# Patient Record
Sex: Female | Born: 1990 | Race: White | Hispanic: No | Marital: Single | Smoking: Current every day smoker
Health system: Southern US, Community
[De-identification: ages and names within clinical notes are randomized; demographics above are authoritative.]

## PROBLEM LIST (undated history)

## (undated) DIAGNOSIS — R519 Headache, unspecified: Secondary | ICD-10-CM

## (undated) DIAGNOSIS — F32A Depression, unspecified: Secondary | ICD-10-CM

## (undated) DIAGNOSIS — F419 Anxiety disorder, unspecified: Secondary | ICD-10-CM

## (undated) HISTORY — DX: Depression, unspecified: F32.A

## (undated) HISTORY — DX: Anxiety disorder, unspecified: F41.9

## (undated) HISTORY — DX: Headache, unspecified: R51.9

---

## 2008-04-13 ENCOUNTER — Emergency Department: Payer: Self-pay | Admitting: Emergency Medicine

## 2013-07-16 ENCOUNTER — Other Ambulatory Visit: Payer: Self-pay

## 2013-07-17 ENCOUNTER — Other Ambulatory Visit (HOSPITAL_COMMUNITY): Payer: Self-pay | Admitting: Obstetrics and Gynecology

## 2013-07-17 DIAGNOSIS — IMO0001 Reserved for inherently not codable concepts without codable children: Secondary | ICD-10-CM

## 2013-07-17 DIAGNOSIS — Z3689 Encounter for other specified antenatal screening: Secondary | ICD-10-CM

## 2013-08-04 ENCOUNTER — Ambulatory Visit (HOSPITAL_COMMUNITY): Admission: RE | Admit: 2013-08-04 | Payer: Medicaid Other | Source: Ambulatory Visit

## 2013-08-04 ENCOUNTER — Encounter (HOSPITAL_COMMUNITY): Payer: Self-pay

## 2013-08-04 ENCOUNTER — Ambulatory Visit (HOSPITAL_COMMUNITY)
Admission: RE | Admit: 2013-08-04 | Discharge: 2013-08-04 | Disposition: A | Discharge status: Home / Self Care | Payer: Medicaid Other | Source: Ambulatory Visit | Attending: Obstetrics and Gynecology | Admitting: Obstetrics and Gynecology

## 2013-08-04 DIAGNOSIS — Z3689 Encounter for other specified antenatal screening: Secondary | ICD-10-CM

## 2013-08-04 DIAGNOSIS — O30009 Twin pregnancy, unspecified number of placenta and unspecified number of amniotic sacs, unspecified trimester: Secondary | ICD-10-CM | POA: Insufficient documentation

## 2013-08-04 DIAGNOSIS — A6000 Herpesviral infection of urogenital system, unspecified: Secondary | ICD-10-CM | POA: Insufficient documentation

## 2013-08-04 DIAGNOSIS — O360990 Maternal care for other rhesus isoimmunization, unspecified trimester, not applicable or unspecified: Secondary | ICD-10-CM | POA: Insufficient documentation

## 2013-08-04 DIAGNOSIS — IMO0001 Reserved for inherently not codable concepts without codable children: Secondary | ICD-10-CM

## 2013-08-04 DIAGNOSIS — O98519 Other viral diseases complicating pregnancy, unspecified trimester: Secondary | ICD-10-CM | POA: Insufficient documentation

## 2013-08-04 DIAGNOSIS — O30039 Twin pregnancy, monochorionic/diamniotic, unspecified trimester: Secondary | ICD-10-CM | POA: Insufficient documentation

## 2013-08-04 IMAGING — US US OB DETAIL EACH ADDL GEST + 14 WK
1 series · 14 of 28 positions shown · non-contrast
Comparison: none

[Series 1: us ob detail each addl gest + 14 wk · 0.18mm/px · 115 acquisitions, 14 frames shown]
[im 5/115]
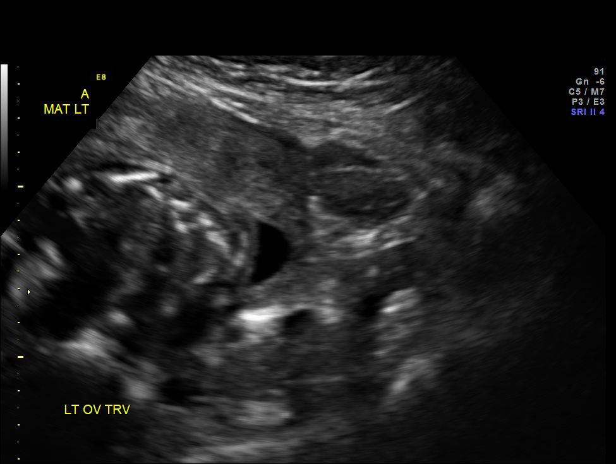
[im 13/115]
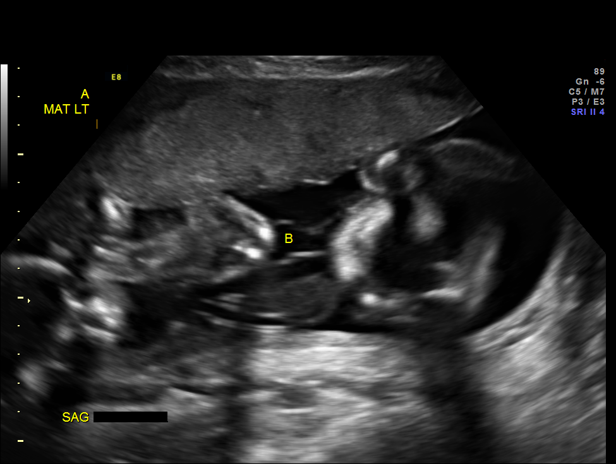
[im 22/115]
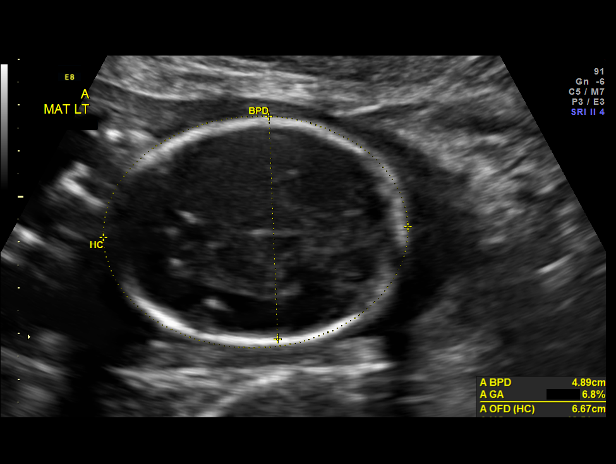
[im 30/115]
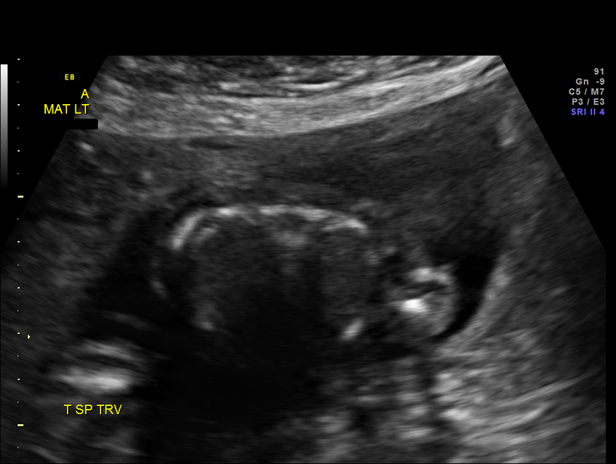
[im 39/115]
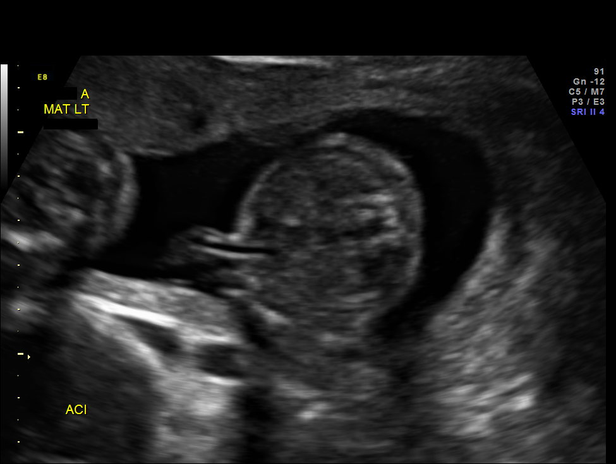
[im 47/115]
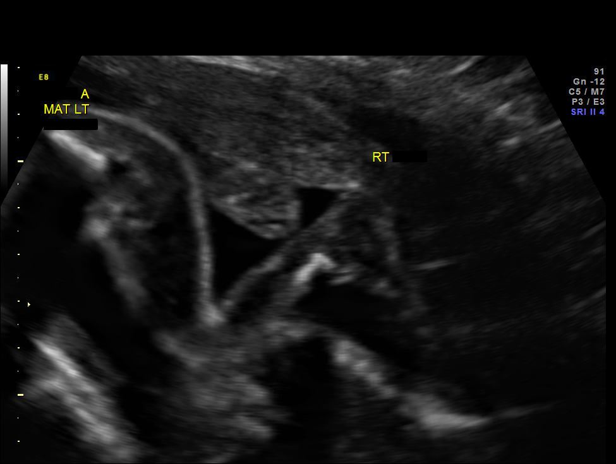
[im 55/115]
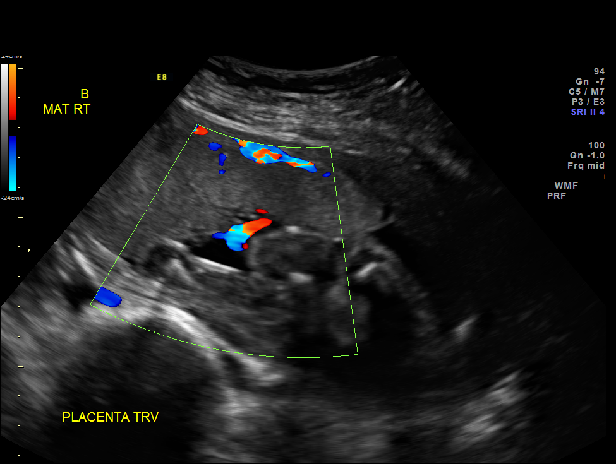
[im 64/115]
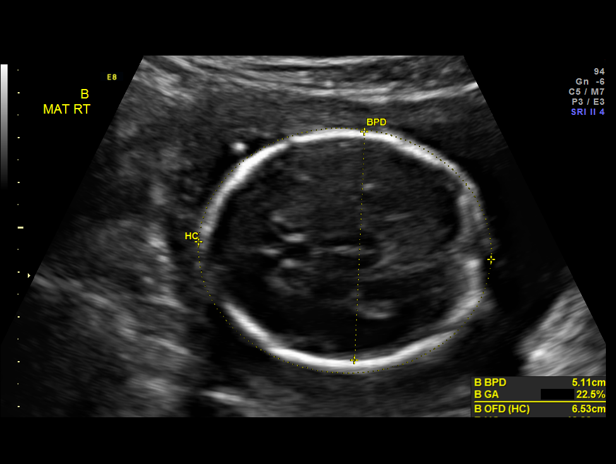
[im 72/115]
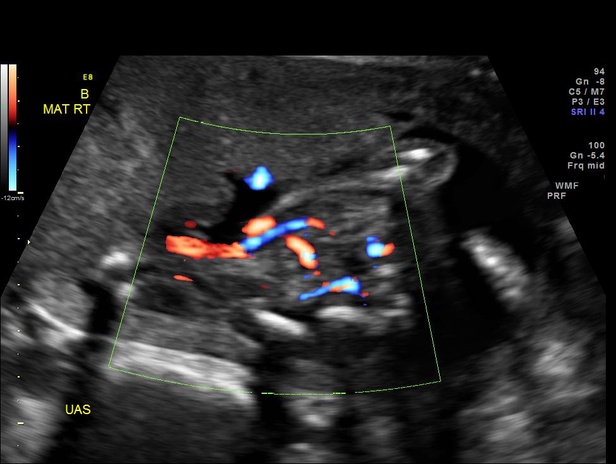
[im 81/115]
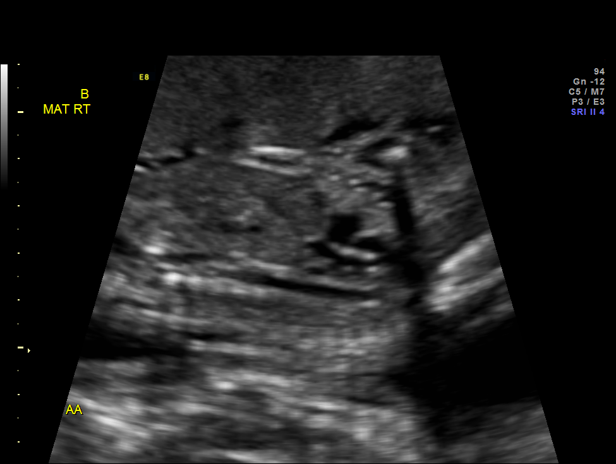
[im 89/115]
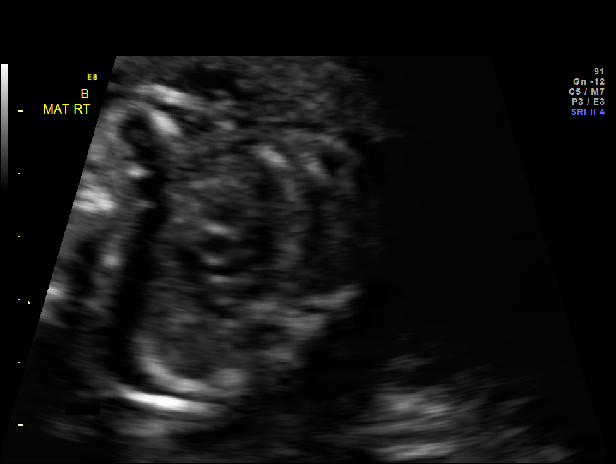
[im 98/115]
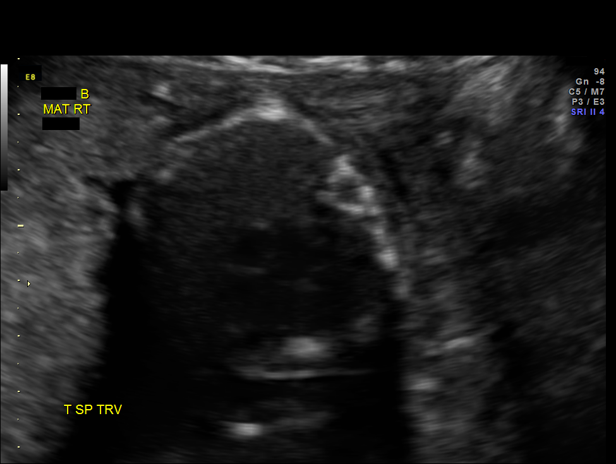
[im 106/115]
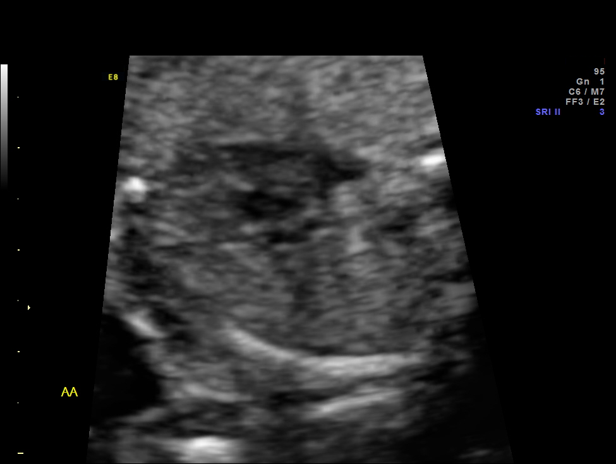
[im 115/115]
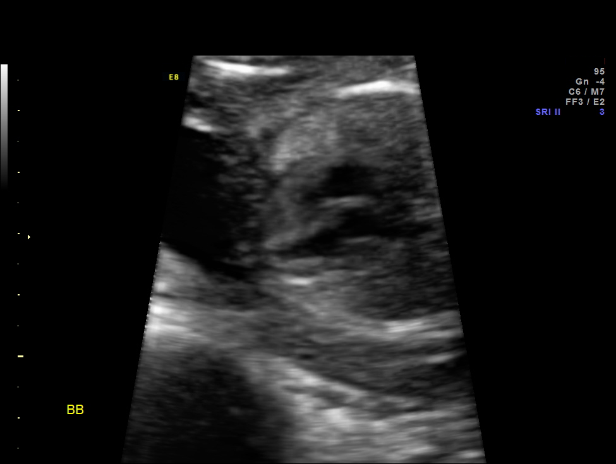

[14 of 28 positions shown; findings below may reference images not displayed]

OBSTETRICS REPORT
                      (Signed Final 08/04/2013 [DATE])

Service(s) Provided

 US OB DETAIL + 14 WK                                  76811.0
 US OB DETAIL ADDL GEST + 14 WK                        76811.1
Indications

 Twin gestation, [place]
 Poor obstetric history: Previous gestational HTN
 Herpes simplex virus (SAID ABDUU)
 Rh negative
Fetal Evaluation (Fetus A)

 Num Of Fetuses:    2
 Fetal Heart Rate:  152                          bpm
 Cardiac Activity:  Observed
 Fetal Lie:         Left Fetus
 Presentation:      Breech
 Placenta:          Anterior, above cervical os
 P. Cord            Visualized, central
 Insertion:

 Membrane Desc:     Dividing
                    Membrane seen
                    - Monochorionic

 Amniotic Fluid
 AFI FV:      Subjectively within normal limits
                                             Larg Pckt:     6.3  cm
Biometry (Fetus A)

 BPD:     48.9  mm     G. Age:  20w 5d                CI:         72.4   70 - 86
 OFD:     67.5  mm                                    FL/HC:      18.9   18.4 -

 HC:     186.6  mm     G. Age:  21w 0d        6  %    HC/AC:      1.11   1.06 -

 AC:     168.4  mm     G. Age:  21w 6d       33  %    FL/BPD:     72.2   71 - 87
 FL:      35.3  mm     G. Age:  21w 1d       13  %    FL/AC:      21.0   20 - 24
 HUM:       34  mm     G. Age:  21w 4d       35  %
 CER:       23  mm     G. Age:  21w 4d       33  %

 Est. FW:     425  gm    0 lb 15 oz      28  %     FW Discordancy      0 \ 0 %
Gestational Age (Fetus A)
 LMP:           22w 1d        Date:  03/02/13                 EDD:   12/07/13
 U/S Today:     21w 1d                                        EDD:   12/14/13
 Best:          22w 1d     Det. By:  LMP  (03/02/13)          EDD:   12/07/13
Anatomy (Fetus A)

 Cranium:          Appears normal         Aortic Arch:      Color
 Fetal Cavum:      Appears normal         Ductal Arch:      Color
 Ventricles:       Appears normal         Diaphragm:        Appears normal
 Choroid Plexus:   Appears normal         Stomach:          Appears normal
 Cerebellum:       Appears normal         Abdomen:          Appears normal
 Posterior Fossa:  Appears normal         Abdominal Wall:   Appears nml (cord
                                                            insert, abd wall)
 Nuchal Fold:      Not applicable (>20    Cord Vessels:     Appears normal (3
                   wks GA)                                  vessel cord)
 Face:             Orbits appear          Kidneys:          Appear normal
                   normal
 Lips:             Not well visualized    Bladder:          Appears normal
 Heart:            Appears normal         Spine:            Appears normal
                   (4CH, axis, and
                   situs)
 RVOT:             Appears normal         Lower             Appears normal
                                          Extremities:
 LVOT:             Appears normal         Upper             Appears normal
                                          Extremities:

 Other:  Fetus appears to be a female. Heels visualized. Technically difficult
         due to fetal position.
Targeted Anatomy (Fetus A)

 Fetal Central Nervous System
 Cisterna Magna:

Fetal Evaluation (Fetus B)

 Num Of Fetuses:    2
 Fetal Heart Rate:  144                          bpm
 Cardiac Activity:  Observed
 Fetal Lie:         Right Fetus
 Presentation:      Cephalic
 Placenta:          Anterior, above cervical os
 P. Cord            Visualized, central
 Insertion:

 Membrane Desc:     Dividing
                    Membrane seen
                    - Monochorionic

 Amniotic Fluid
 AFI FV:      Subjectively within normal limits
                                             Larg Pckt:     3.3  cm
Biometry (Fetus B)

 BPD:     51.3  mm     G. Age:  21w 4d                CI:         77.6   70 - 86
 OFD:     66.1  mm                                    FL/HC:      19.1   18.4 -

 HC:     189.6  mm     G. Age:  21w 2d        9  %    HC/AC:      1.16   1.06 -

 AC:     163.7  mm     G. Age:  21w 3d       22  %    FL/BPD:     70.8   71 - 87
 FL:      36.3  mm     G. Age:  21w 4d       21  %    FL/AC:      22.2   20 - 24
 HUM:     33.1  mm     G. Age:  21w 1d       25  %
 CER:     22.8  mm     G. Age:  21w 3d       30  %

 Est. FW:     425  gm    0 lb 15 oz      28  %     FW Discordancy      0 \ 0 %
Gestational Age (Fetus B)

 LMP:           22w 1d        Date:  03/02/13                 EDD:   12/07/13
 U/S Today:     21w 3d                                        EDD:   12/12/13
 Best:          22w 1d     Det. By:  LMP  (03/02/13)          EDD:   12/07/13
Anatomy (Fetus B)

 Cranium:          Appears normal         Aortic Arch:      Appears normal
 Fetal Cavum:      Appears normal         Ductal Arch:      Not well visualized
 Ventricles:       Appears normal         Diaphragm:        Appears normal
 Choroid Plexus:   Appears normal         Stomach:          Appears normal
 Cerebellum:       Appears normal         Abdomen:          Appears normal
 Posterior Fossa:  Appears normal         Abdominal Wall:   Appears nml (cord
                                                            insert, abd wall)
 Nuchal Fold:      Not applicable (>20    Cord Vessels:     Appears normal (3
                   wks GA)                                  vessel cord)
 Face:             Orbits appear          Kidneys:          Appear normal
                   normal
 Lips:             Appears normal         Bladder:          Appears normal
 Heart:            Appears normal         Spine:            Appears normal
                   (4CH, axis, and
                   situs)
 RVOT:             Appears normal         Lower             Appears normal
                                          Extremities:
 LVOT:             Appears normal         Upper             Appears normal
                                          Extremities:

 Other:  Fetus appears to be a female. Technically difficult due to fetal position.
Targeted Anatomy (Fetus B)

 Fetal Central Nervous System
 Cisterna Magna:
Cervix Uterus Adnexa

 Cervical Length:    3.7      cm

 Cervix:       Normal appearance by transabdominal scan.
 Left Ovary:    Within normal limits.
 Right Ovary:   Within normal limits.

 Adnexa:     No abnormality visualized.
Impression

 Monochorionic/diamniotic twin pregnancy at 22+1 weeks
 Normal detailed fetal anatomy x 2 except for face view on
 Twin A; profile and DA on Twin B
 Normal amniotic fluid volume x 2
 Measurements consistent with LMP dating x 2; EFWs at the
 28th %tiles
Recommendations

 Every 2 weeks ultrasounds: fluid check at one visit; growth
 and UA dopplers at next visit looking for evidence of TTTS
 Fetal ECHOs scheduled
 Begin antenatal testing at 32 weeks
 Deliver between 36 and 37 weeks

 questions or concerns.

## 2013-08-17 ENCOUNTER — Other Ambulatory Visit (HOSPITAL_COMMUNITY): Payer: Self-pay | Admitting: Obstetrics and Gynecology

## 2013-08-17 DIAGNOSIS — O30009 Twin pregnancy, unspecified number of placenta and unspecified number of amniotic sacs, unspecified trimester: Secondary | ICD-10-CM

## 2013-08-19 ENCOUNTER — Ambulatory Visit (HOSPITAL_COMMUNITY)
Admission: RE | Admit: 2013-08-19 | Discharge: 2013-08-19 | Disposition: A | Discharge status: Home / Self Care | Payer: Medicaid Other | Source: Ambulatory Visit | Attending: Obstetrics and Gynecology | Admitting: Obstetrics and Gynecology

## 2013-08-19 ENCOUNTER — Ambulatory Visit (HOSPITAL_COMMUNITY): Payer: Medicaid Other

## 2013-08-19 DIAGNOSIS — O30009 Twin pregnancy, unspecified number of placenta and unspecified number of amniotic sacs, unspecified trimester: Secondary | ICD-10-CM

## 2013-08-19 DIAGNOSIS — A6000 Herpesviral infection of urogenital system, unspecified: Secondary | ICD-10-CM | POA: Insufficient documentation

## 2013-08-19 DIAGNOSIS — O98519 Other viral diseases complicating pregnancy, unspecified trimester: Secondary | ICD-10-CM | POA: Insufficient documentation

## 2013-08-19 DIAGNOSIS — O360990 Maternal care for other rhesus isoimmunization, unspecified trimester, not applicable or unspecified: Secondary | ICD-10-CM | POA: Insufficient documentation

## 2013-08-19 DIAGNOSIS — O09299 Supervision of pregnancy with other poor reproductive or obstetric history, unspecified trimester: Secondary | ICD-10-CM | POA: Insufficient documentation

## 2013-08-19 IMAGING — US US OB LIMITED
1 series · 13 of 16 positions shown · non-contrast
Comparison: none

[Series 1: us ob limited · 0.28mm/px · 13 of 16 slices shown]
[im 1/16]
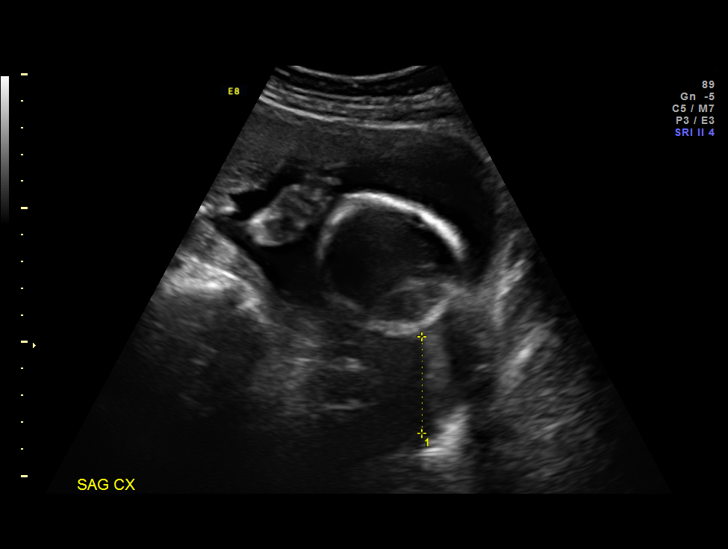
[im 2/16]
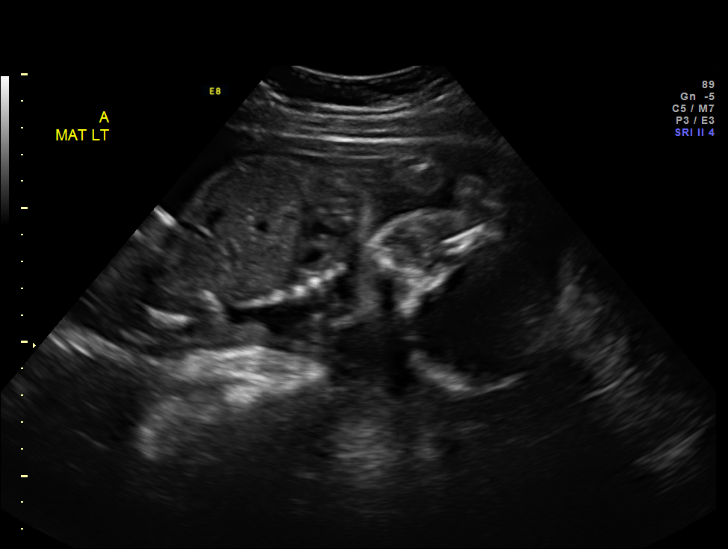
[im 4/16]
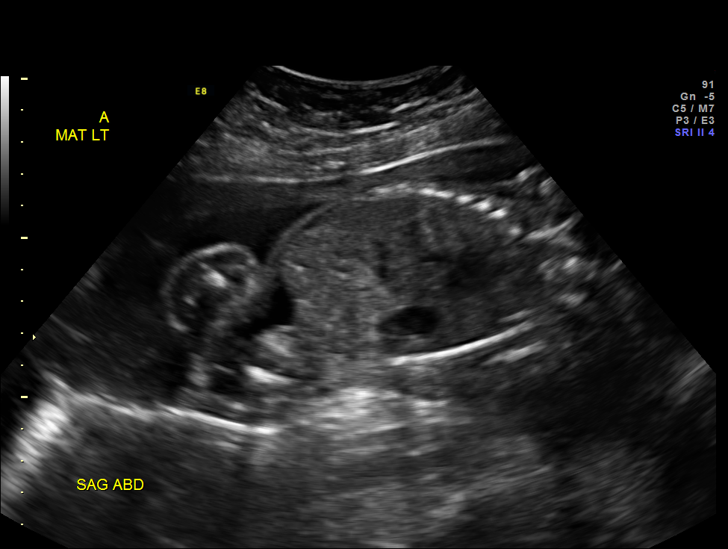
[im 5/16]
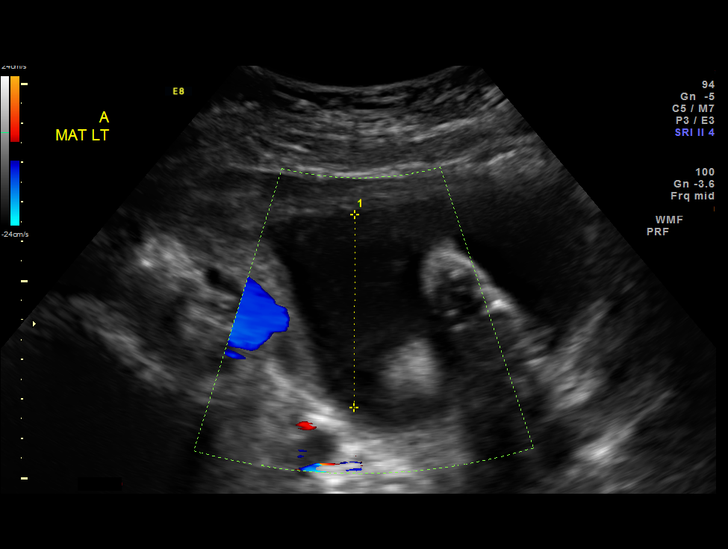
[im 6/16]
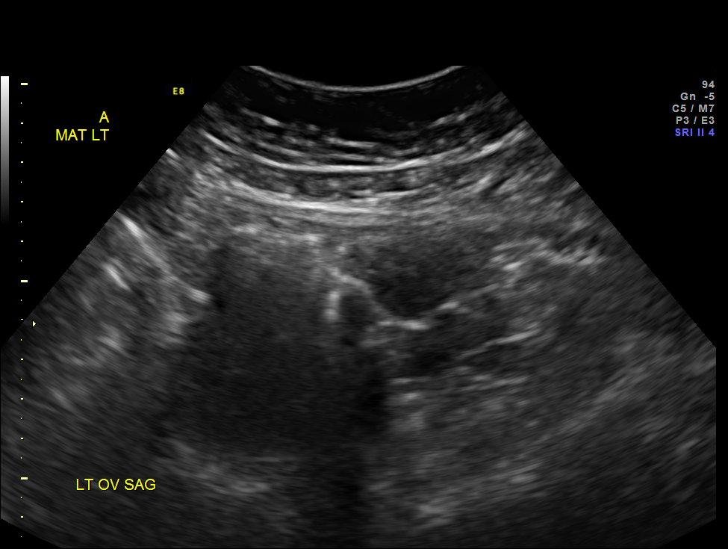
[im 7/16]
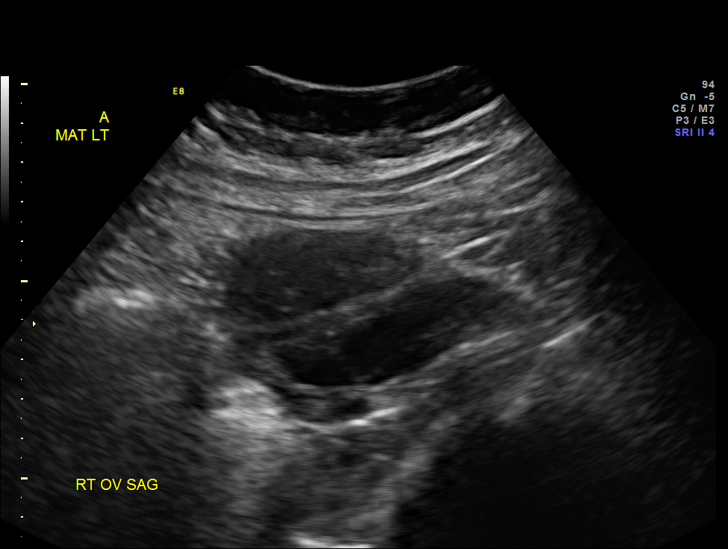
[im 9/16]
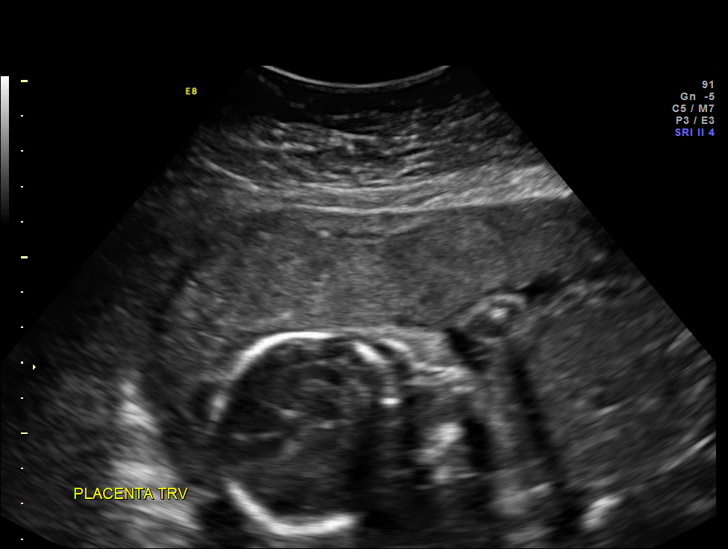
[im 10/16]
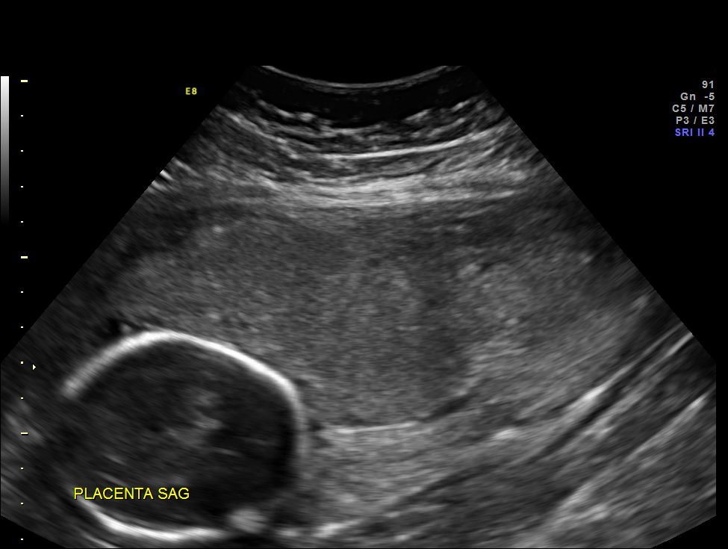
[im 11/16]
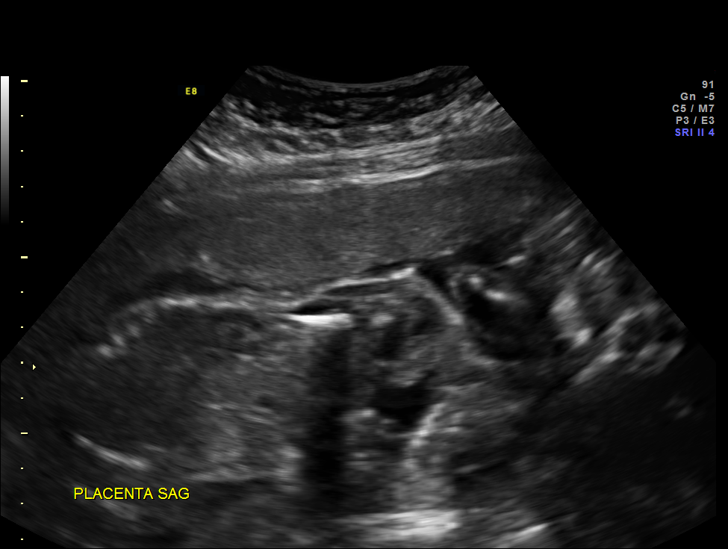
[im 12/16]
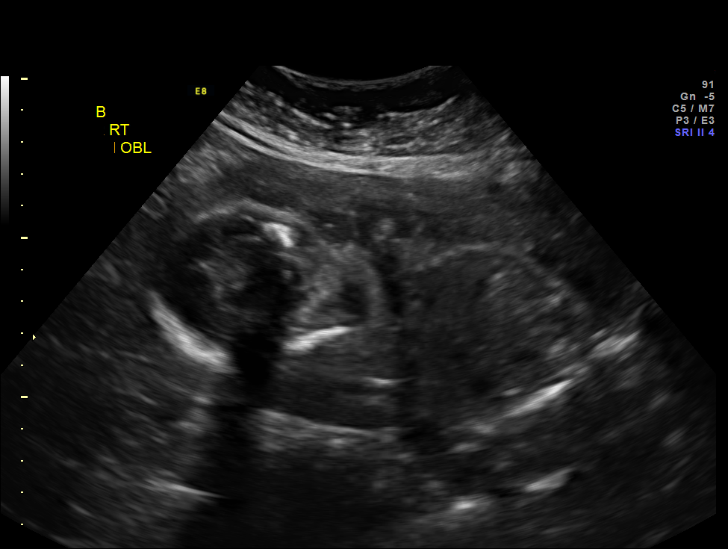
[im 13/16]
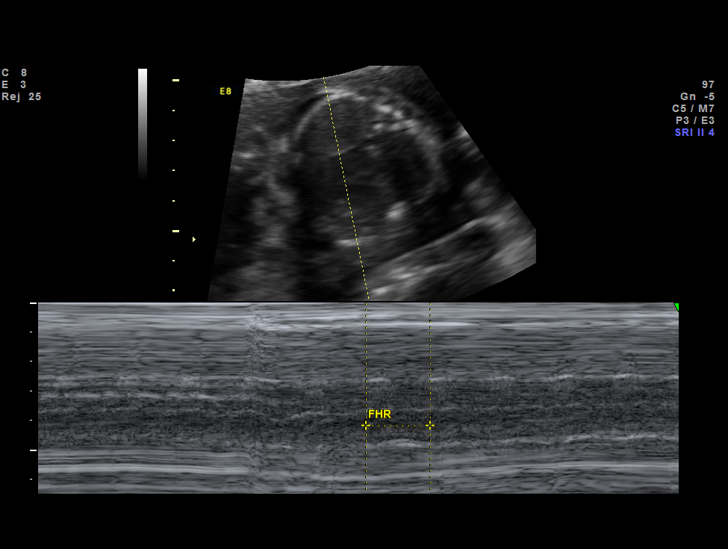
[im 15/16]
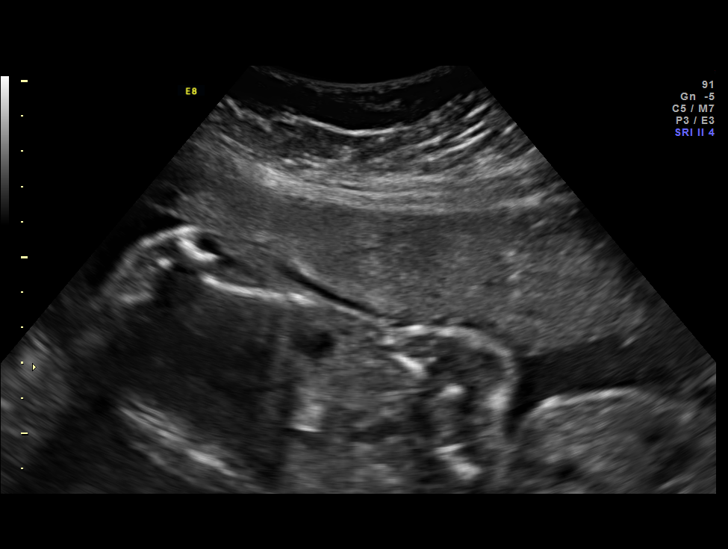
[im 16/16]
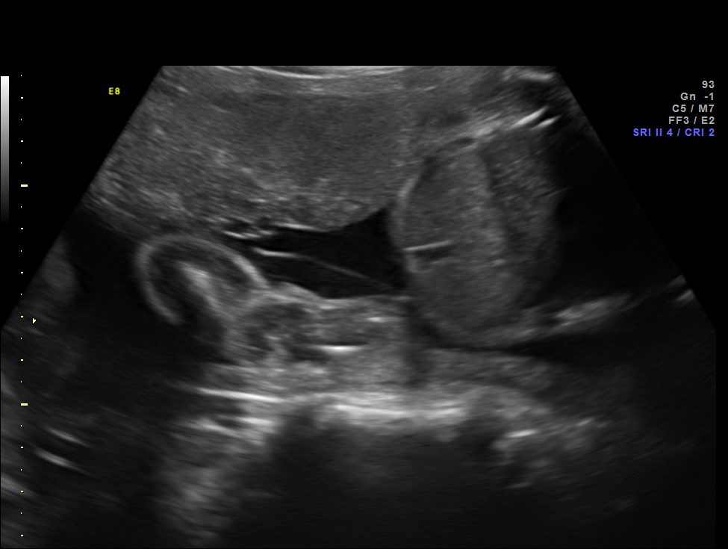

[13 of 16 positions shown; findings below may reference images not displayed]

OBSTETRICS REPORT
                      (Signed Final 08/19/2013 [DATE])

Service(s) Provided

 [HOSPITAL]                                         76815.0
Indications

 Twin gestation, [place]
 Poor obstetric history: Previous gestational HTN
 Herpes simplex virus (SURGUN)
 Rh negative
Fetal Evaluation (Fetus A)

 Num Of Fetuses:    2
 Fetal Heart Rate:  138                          bpm
 Cardiac Activity:  Observed
 Fetal Lie:         Left Fetus
 Presentation:      Cephalic
 Placenta:          Anterior, above cervical os
 P. Cord            Previously Visualized
 Insertion:

 Membrane Desc:     Dividing
                    Membrane seen
                    - Monochorionic

 Amniotic Fluid
 AFI FV:      Subjectively within normal limits
                                             Larg Pckt:     6.3  cm
Gestational Age (Fetus A)

 LMP:           24w 2d        Date:  03/02/13                 EDD:   12/07/13
 Best:          24w 2d     Det. By:  LMP  (03/02/13)          EDD:   12/07/13

Fetal Evaluation (Fetus B)

 Num Of Fetuses:    2
 Fetal Heart Rate:  157                          bpm
 Cardiac Activity:  Observed
 Fetal Lie:         Right Fetus
 Presentation:      Breech oblique
 Placenta:          Anterior, above cervical os
 P. Cord            Previously Visualized
 Insertion:
 Membrane Desc:     Dividing
                    Membrane seen
                    - Monochorionic

 Amniotic Fluid
 AFI FV:      Subjectively within normal limits
                                             Larg Pckt:     3.6  cm
Gestational Age (Fetus B)

 LMP:           24w 2d        Date:  03/02/13                 EDD:   12/07/13
 Best:          24w 2d     Det. By:  LMP  (03/02/13)          EDD:   12/07/13
Cervix Uterus Adnexa

 Cervical Length:    3.6      cm

 Cervix:       Normal appearance by transabdominal scan.
 Left Ovary:    Within normal limits.
 Right Ovary:   Within normal limits.
 Adnexa:     No abnormality visualized.
Impression

 Monochorionic/diamniotic twin pregnancy at 24+4 weeks
 Normal amniotic fluid volume x 2
 Bladder visualized x 2

 Fetal ECHOs normal x 2
Recommendations

 Follow-up ultrasound for growth, AFV and TTTS check in 2
 weeks

 questions or concerns.

## 2013-09-01 ENCOUNTER — Other Ambulatory Visit (HOSPITAL_COMMUNITY): Payer: Self-pay | Admitting: Obstetrics and Gynecology

## 2013-09-01 DIAGNOSIS — O30009 Twin pregnancy, unspecified number of placenta and unspecified number of amniotic sacs, unspecified trimester: Secondary | ICD-10-CM

## 2013-09-02 ENCOUNTER — Other Ambulatory Visit (HOSPITAL_COMMUNITY): Payer: Self-pay | Admitting: Obstetrics and Gynecology

## 2013-09-02 ENCOUNTER — Encounter (HOSPITAL_COMMUNITY): Payer: Self-pay

## 2013-09-02 ENCOUNTER — Ambulatory Visit (HOSPITAL_COMMUNITY)
Admission: RE | Admit: 2013-09-02 | Discharge: 2013-09-02 | Disposition: A | Discharge status: Home / Self Care | Payer: Medicaid Other | Source: Ambulatory Visit | Attending: Obstetrics and Gynecology | Admitting: Obstetrics and Gynecology

## 2013-09-02 DIAGNOSIS — O98519 Other viral diseases complicating pregnancy, unspecified trimester: Secondary | ICD-10-CM | POA: Insufficient documentation

## 2013-09-02 DIAGNOSIS — O30009 Twin pregnancy, unspecified number of placenta and unspecified number of amniotic sacs, unspecified trimester: Secondary | ICD-10-CM

## 2013-09-02 DIAGNOSIS — O09299 Supervision of pregnancy with other poor reproductive or obstetric history, unspecified trimester: Secondary | ICD-10-CM | POA: Insufficient documentation

## 2013-09-02 DIAGNOSIS — O30039 Twin pregnancy, monochorionic/diamniotic, unspecified trimester: Secondary | ICD-10-CM | POA: Insufficient documentation

## 2013-09-02 DIAGNOSIS — A6000 Herpesviral infection of urogenital system, unspecified: Secondary | ICD-10-CM | POA: Insufficient documentation

## 2013-09-02 NOTE — ED Notes (Signed)
Patient states she does have occasional HA with blurry vision and times when she feels she cannot see well.  Her arms will usually go numb and then the HA comes after the triggers.

## 2013-09-07 ENCOUNTER — Other Ambulatory Visit (HOSPITAL_COMMUNITY): Payer: Self-pay | Admitting: Obstetrics and Gynecology

## 2013-09-07 DIAGNOSIS — O30009 Twin pregnancy, unspecified number of placenta and unspecified number of amniotic sacs, unspecified trimester: Secondary | ICD-10-CM

## 2013-09-09 ENCOUNTER — Ambulatory Visit (HOSPITAL_COMMUNITY)
Admission: RE | Admit: 2013-09-09 | Discharge: 2013-09-09 | Disposition: A | Discharge status: Home / Self Care | Payer: Medicaid Other | Source: Ambulatory Visit | Attending: Obstetrics and Gynecology | Admitting: Obstetrics and Gynecology

## 2013-09-09 DIAGNOSIS — O365990 Maternal care for other known or suspected poor fetal growth, unspecified trimester, not applicable or unspecified: Secondary | ICD-10-CM | POA: Insufficient documentation

## 2013-09-09 DIAGNOSIS — O09299 Supervision of pregnancy with other poor reproductive or obstetric history, unspecified trimester: Secondary | ICD-10-CM | POA: Insufficient documentation

## 2013-09-09 DIAGNOSIS — O30009 Twin pregnancy, unspecified number of placenta and unspecified number of amniotic sacs, unspecified trimester: Secondary | ICD-10-CM | POA: Insufficient documentation

## 2013-09-09 IMAGING — US US FETAL BPP W/O NONSTRESS
1 series · 13 of 28 positions shown · non-contrast
Comparison: none

[Series 1: us fetal bpp w/o nonstress · 0.30mm/px · 31 acquisitions, 13 frames shown]
[im 2/31]
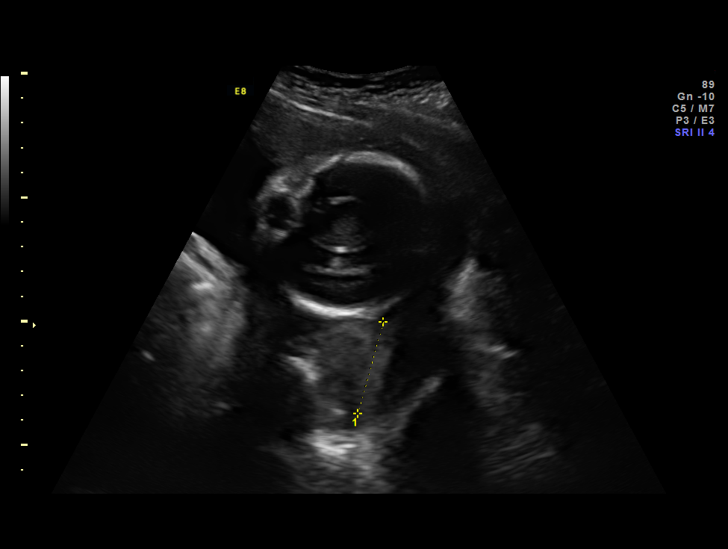
[im 4/31]
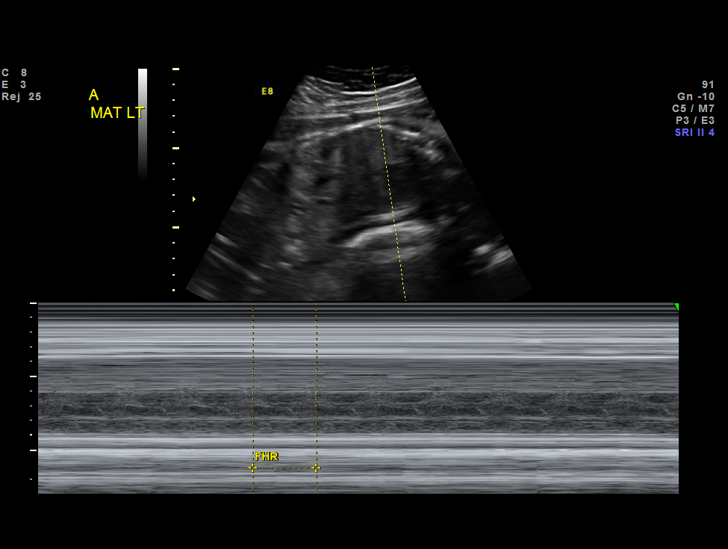
[im 6/31]
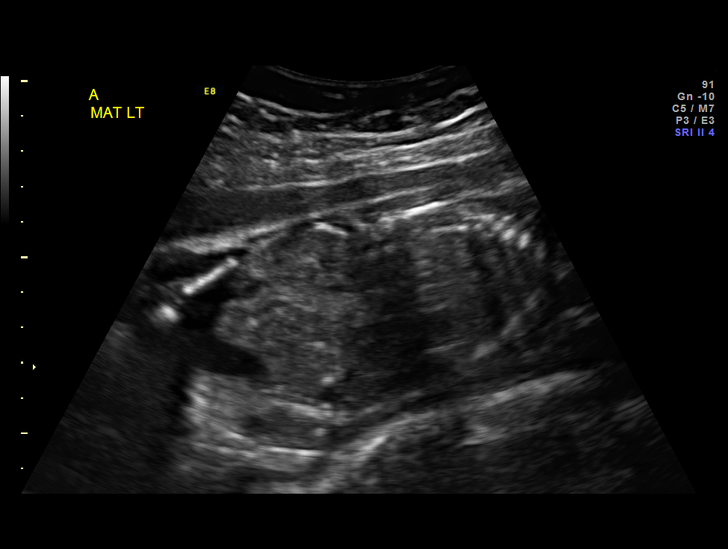
[im 8/31]
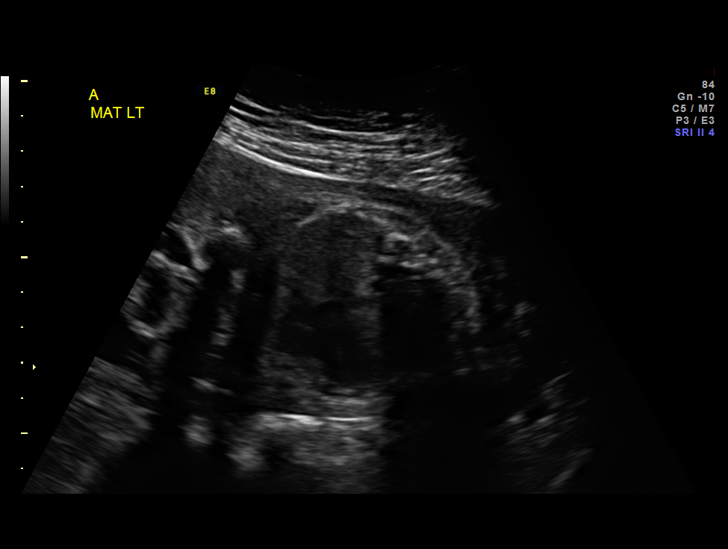
[im 11/31]
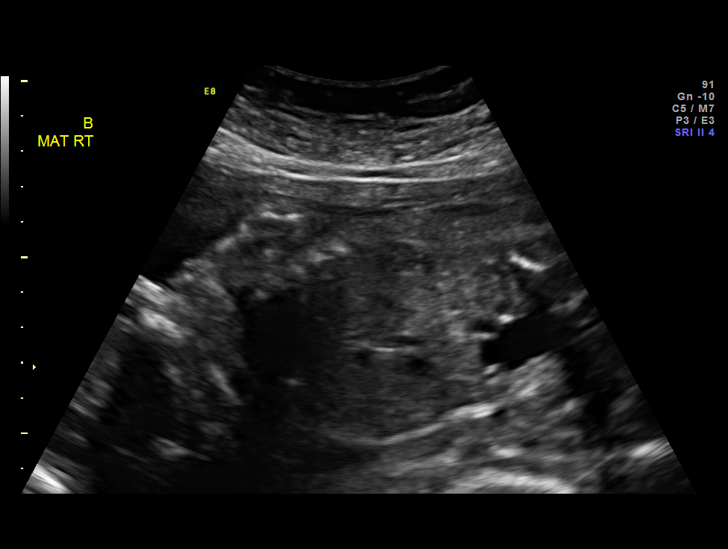
[im 13/31]
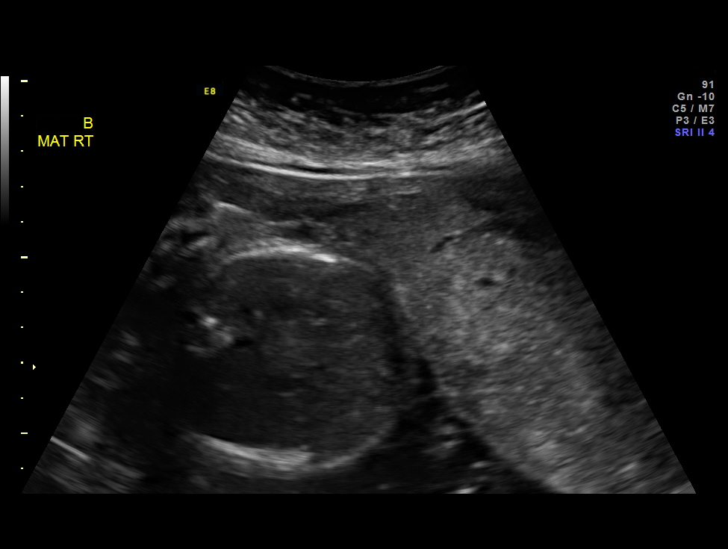
[im 16/31]
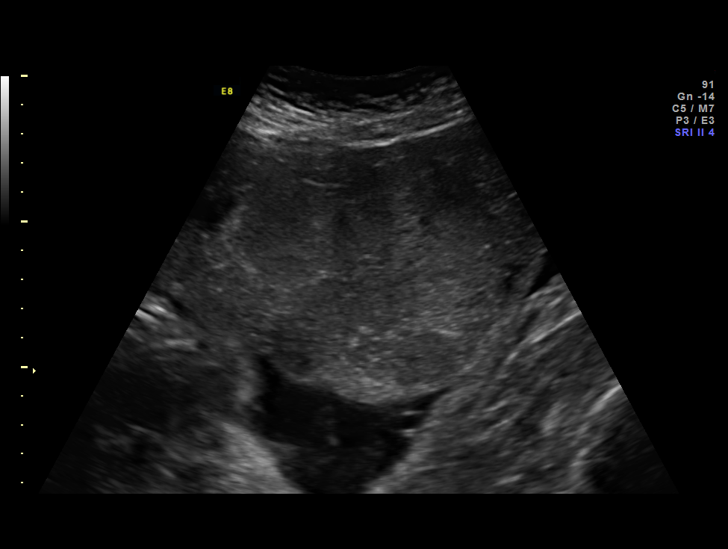
[im 18/31]
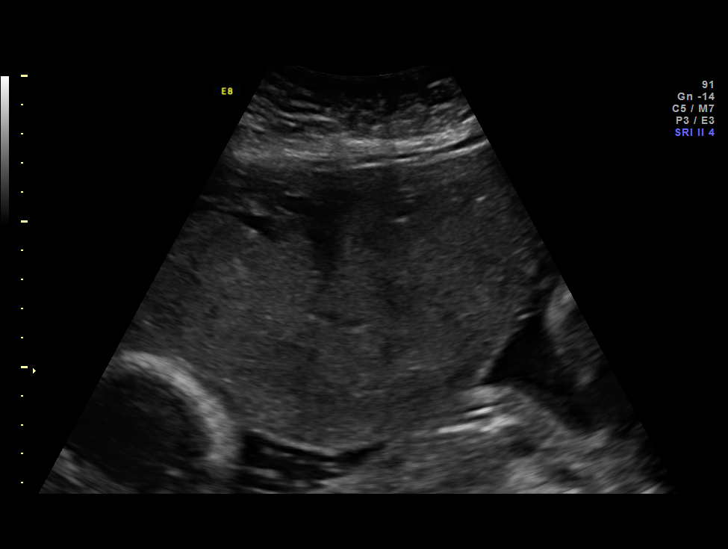
[im 21/31]
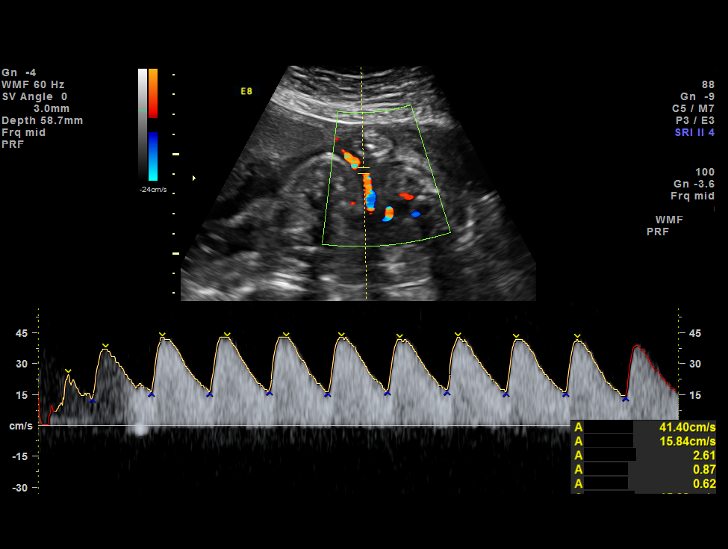
[im 23/31]
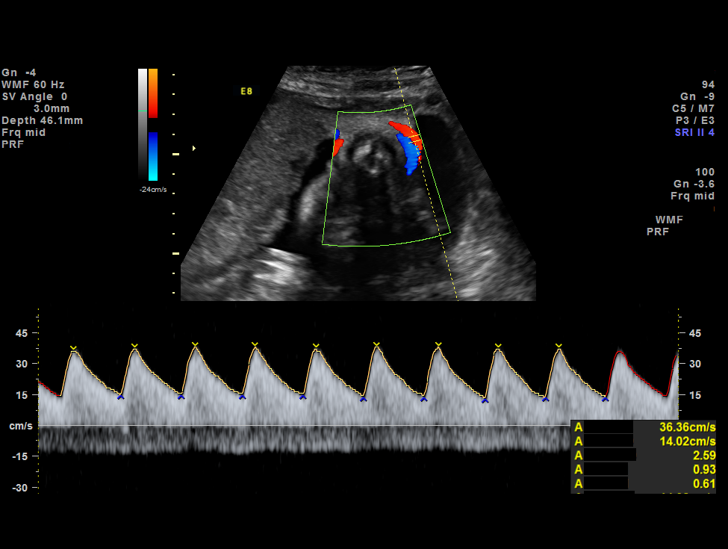
[im 25/31]
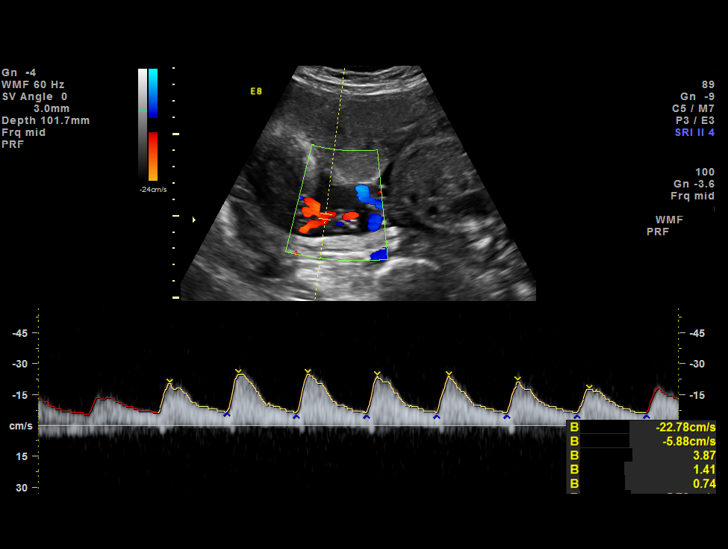
[im 27/31]
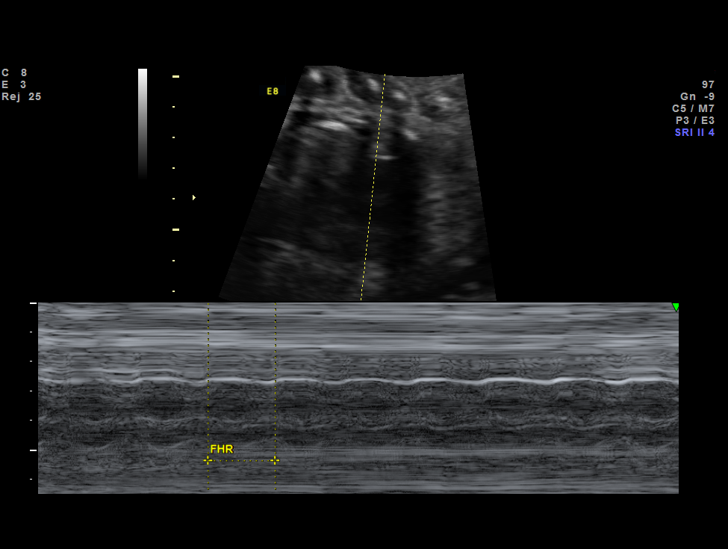
[im 29/31]
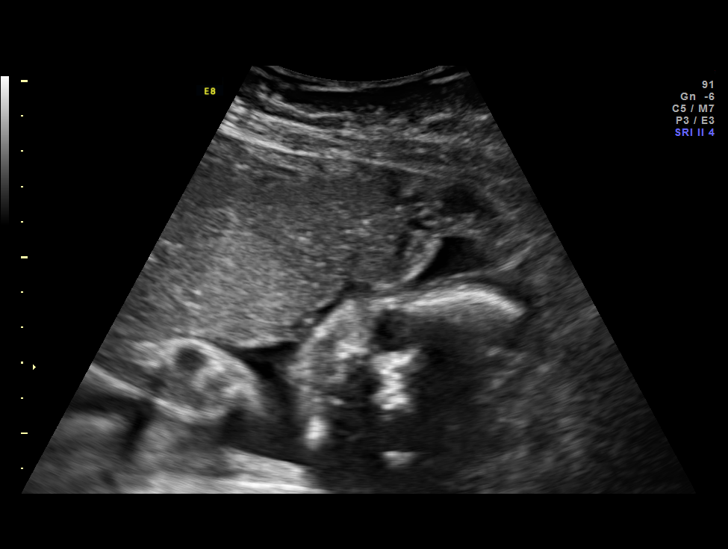

[13 of 28 positions shown; findings below may reference images not displayed]

OBSTETRICS REPORT
                      (Signed Final 09/09/2013 [DATE])

Service(s) Provided

 US UA CORD DOPPLER                                    76820.0
 US UA ADDL GEST                                       76820.1
Indications

 Twin gestation, [place]
 Poor obstetric history: Previous gestational HTN
 Twin B: (presenting twin) growth restriction (AC <
 3rd %tile)
Fetal Evaluation (Fetus A)

 Num Of Fetuses:    2
 Fetal Heart Rate:  159                          bpm
 Cardiac Activity:  Observed
 Fetal Lie:         Left Fetus
 Presentation:      Transverse
 Placenta:          Anterior, above cervical os
 P. Cord            Previously Visualized
 Insertion:

 Membrane Desc:     Dividing
                    Membrane seen
                    - Monochorionic

 Amniotic Fluid
 AFI FV:      Subjectively within normal limits
                                             Larg Pckt:     5.1  cm
Biophysical Evaluation (Fetus A)

 Amniotic F.V:   Within normal limits       F. Tone:        Observed
 F. Movement:    Observed                   Score:          [DATE]
 F. Breathing:   Observed
Gestational Age (Fetus A)

 LMP:           27w 2d        Date:  03/02/13                 EDD:   12/07/13
 Best:          27w 2d     Det. By:  LMP  (03/02/13)          EDD:   12/07/13
Doppler - Fetal Vessels (Fetus A)
 Umbilical Artery
 S/D:   2.58           23  %tile

Fetal Evaluation (Fetus B)

 Num Of Fetuses:    2
 Fetal Heart Rate:  150                          bpm
 Cardiac Activity:  Observed
 Fetal Lie:         Right Fetus
 Presentation:      Cephalic
 Placenta:          Anterior, above cervical os
 P. Cord            Previously Visualized
 Insertion:

 Membrane Desc:     Dividing
                    Membrane seen
                    - Monochorionic

 Amniotic Fluid
 AFI FV:      Subjectively low-normal
                                             Larg Pckt:     4.3  cm
Biophysical Evaluation (Fetus B)

 Amniotic F.V:   Within normal limits       F. Tone:        Observed
 F. Movement:    Observed                   Score:          [DATE]
 F. Breathing:   Observed
Gestational Age (Fetus B)

 LMP:           27w 2d        Date:  03/02/13                 EDD:   12/07/13
 Best:          27w 2d     Det. By:  LMP  (03/02/13)          EDD:   12/07/13
Doppler - Fetal Vessels (Fetus B)

 Umbilical Artery
 S/D:   3.99           85  %tile

Cervix Uterus Adnexa

 Cervical Length:    3.8      cm

 Cervix:       Normal appearance by transabdominal scan. Appears
               closed, without funnelling.
Impression

 Monochorionic/diamniotic twin pregnancy at 27+2 weeks
 Cephalic/transverse position
 Twin B (now presenting) with lagging growth (AC < 3rd %tile)
 UA dopplers were normal for this GA x 2
 BPP [DATE] x 2
Recommendations

 Continue weekly fetal assessments
 questions or concerns.

## 2013-09-09 NOTE — ED Notes (Signed)
Patient states she has been having RUQ pain with fetal movement. This has been occurring over the last couple of days.

## 2013-09-15 ENCOUNTER — Other Ambulatory Visit (HOSPITAL_COMMUNITY): Payer: Self-pay | Admitting: Obstetrics and Gynecology

## 2013-09-15 DIAGNOSIS — O30009 Twin pregnancy, unspecified number of placenta and unspecified number of amniotic sacs, unspecified trimester: Secondary | ICD-10-CM

## 2013-09-16 ENCOUNTER — Ambulatory Visit (HOSPITAL_COMMUNITY)
Admission: RE | Admit: 2013-09-16 | Discharge: 2013-09-16 | Disposition: A | Discharge status: Home / Self Care | Payer: Medicaid Other | Source: Ambulatory Visit | Attending: Obstetrics and Gynecology | Admitting: Obstetrics and Gynecology

## 2013-09-16 DIAGNOSIS — O30039 Twin pregnancy, monochorionic/diamniotic, unspecified trimester: Secondary | ICD-10-CM | POA: Insufficient documentation

## 2013-09-16 DIAGNOSIS — O30009 Twin pregnancy, unspecified number of placenta and unspecified number of amniotic sacs, unspecified trimester: Secondary | ICD-10-CM | POA: Insufficient documentation

## 2013-09-16 DIAGNOSIS — O09299 Supervision of pregnancy with other poor reproductive or obstetric history, unspecified trimester: Secondary | ICD-10-CM | POA: Insufficient documentation

## 2013-09-16 DIAGNOSIS — O365990 Maternal care for other known or suspected poor fetal growth, unspecified trimester, not applicable or unspecified: Secondary | ICD-10-CM | POA: Insufficient documentation

## 2013-09-16 IMAGING — US US UA ADDL GEST
1 series · 13 of 16 positions shown · non-contrast
Comparison: none

[Series 1: us ua addl gest · 0.23mm/px · 20 acquisitions, 13 frames shown]
[im 1/20]
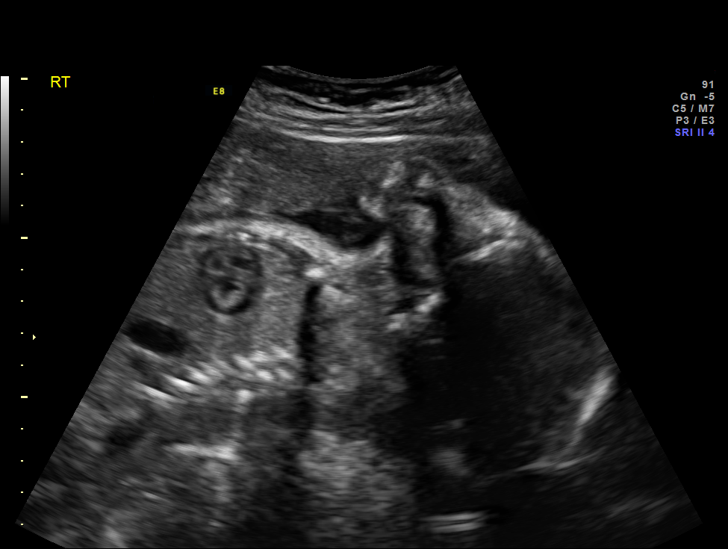
[im 2/20]
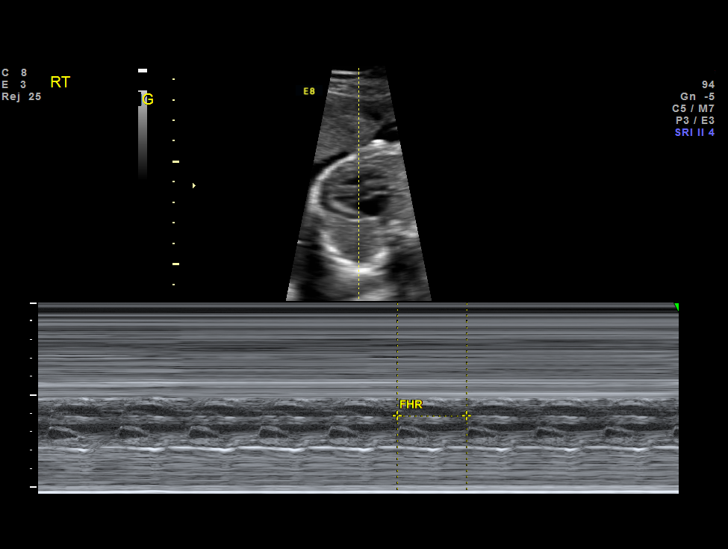
[im 4/20]
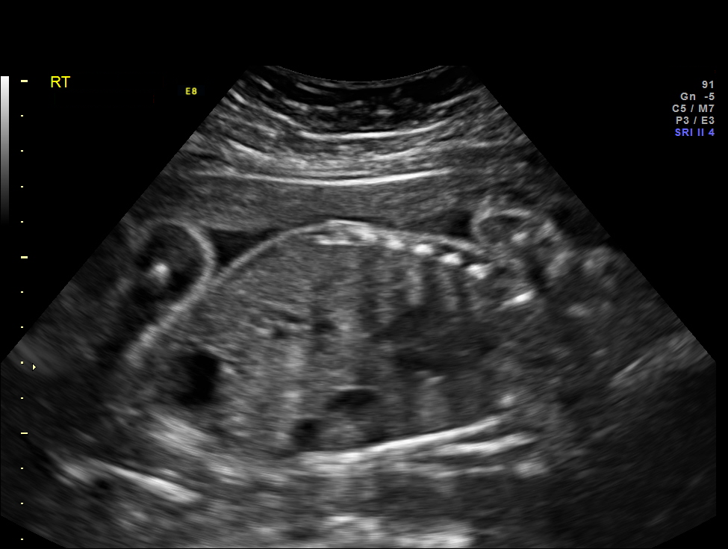
[im 6/20]
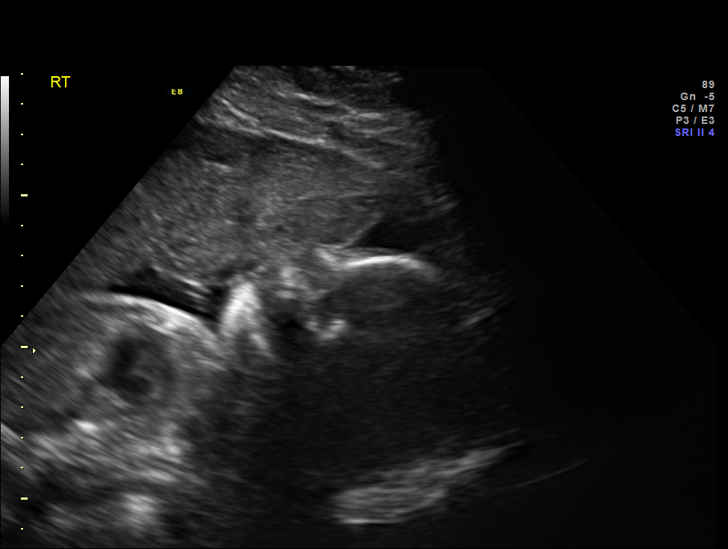
[im 7/20]
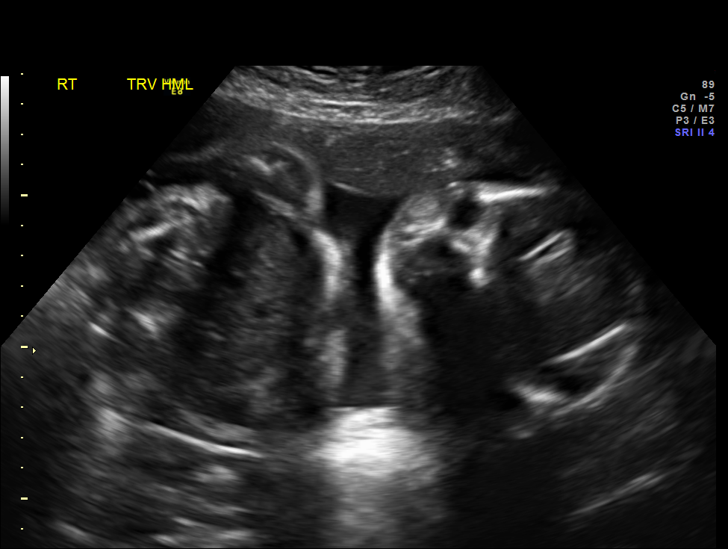
[im 8/20]
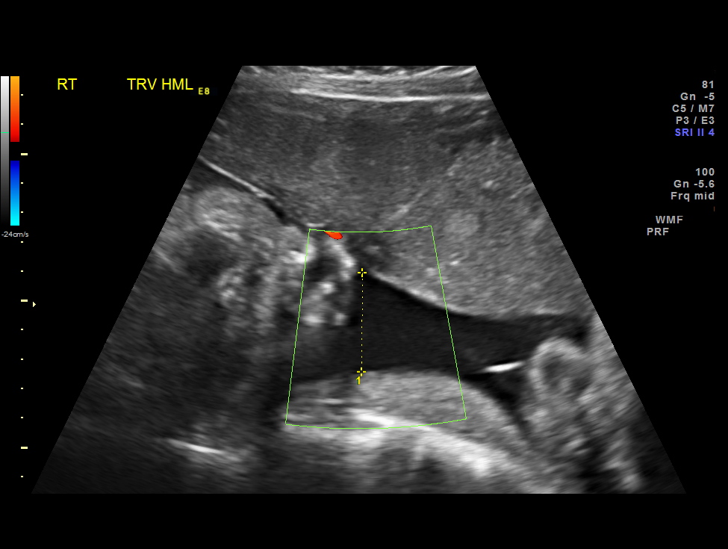
[im 11/20]
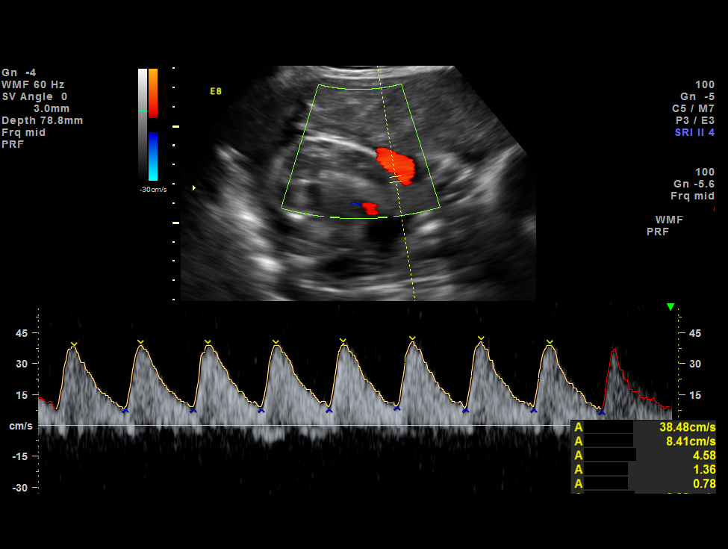
[im 12/20]
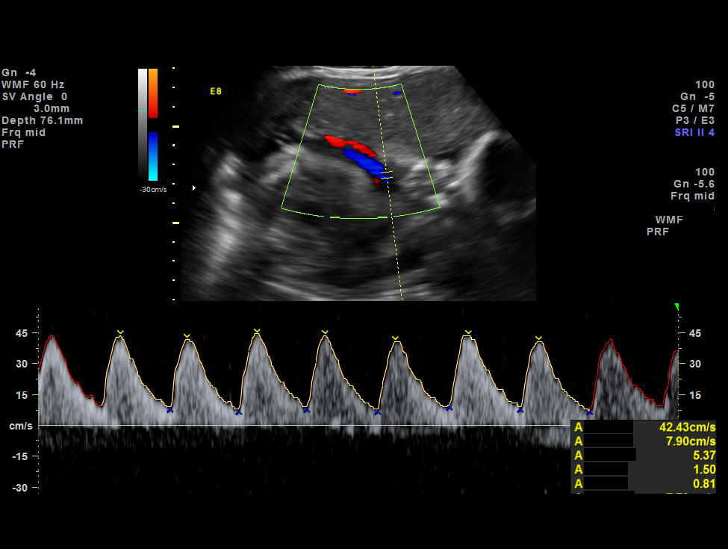
[im 13/20]
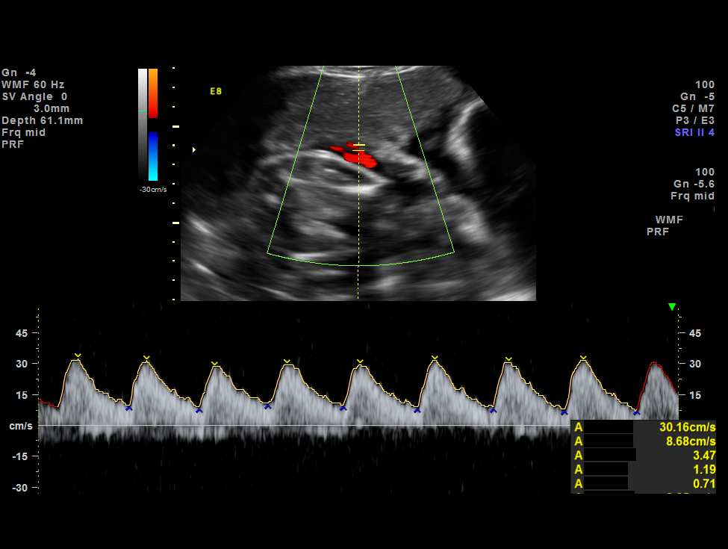
[im 14/20]
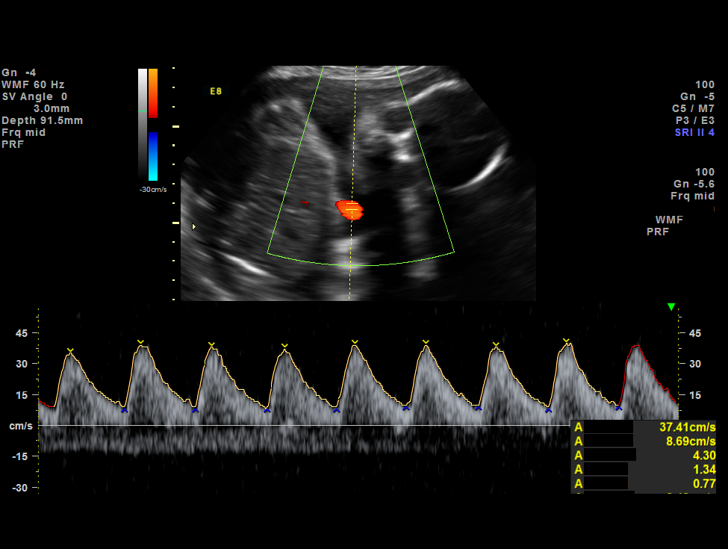
[im 16/20]
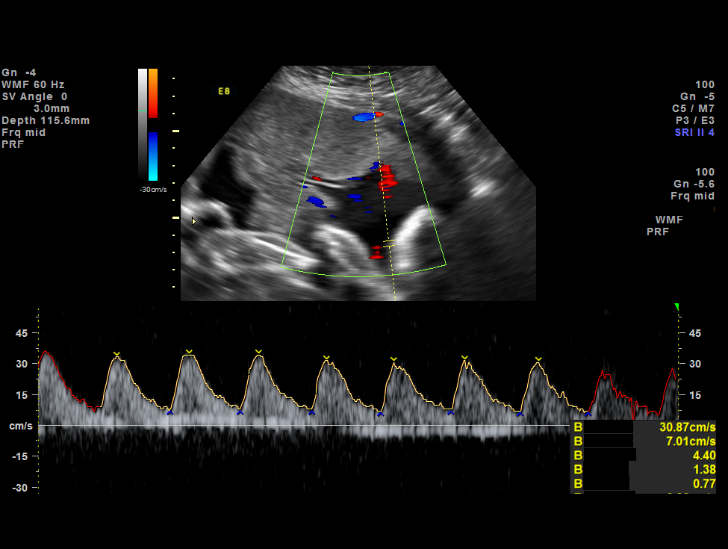
[im 18/20]
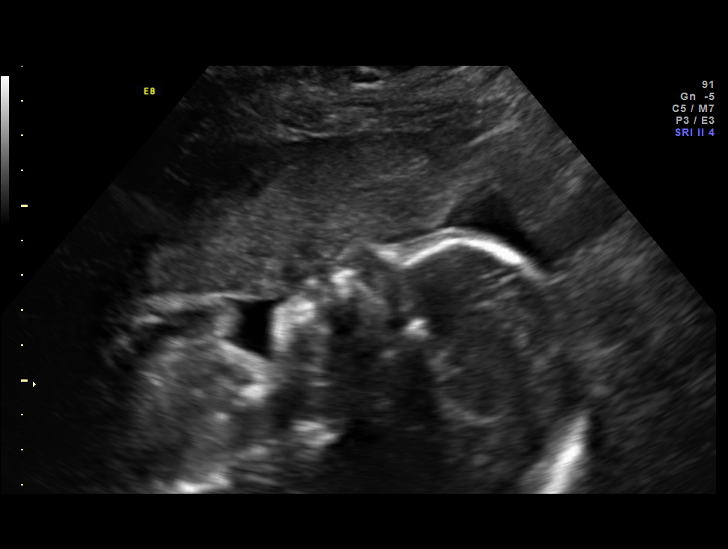
[im 20/20]
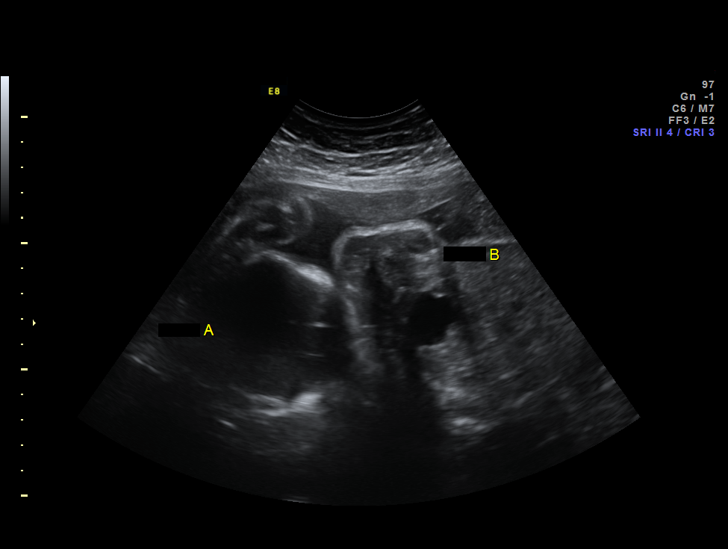

[13 of 16 positions shown; findings below may reference images not displayed]

OBSTETRICS REPORT
                      (Signed Final 09/16/2013 [DATE])

Service(s) Provided

 US UA CORD DOPPLER                                    76820.0
 US UA ADDL GEST                                       76820.1
Indications

 Twin gestation, [place]
 Poor obstetric history: Previous gestational HTN
 Twin B: (presenting twin) growth restriction (AC <
 3rd %tile)
Fetal Evaluation (Fetus A)

 Num Of Fetuses:    2
 Fetal Heart Rate:  145                          bpm
 Cardiac Activity:  Observed
 Fetal Lie:         Left Fetus
 Presentation:      Cephalic

 Membrane Desc:     Dividing
                    Membrane seen
                    - Monochorionic

 Amniotic Fluid
 AFI FV:      Subjectively within normal limits
                                             Larg Pckt:    4.86  cm
Biophysical Evaluation (Fetus A)

 Amniotic F.V:   Pocket => 2 cm two         F. Tone:        Observed
                 planes
 F. Movement:    Observed                   Score:          [DATE]
 F. Breathing:   Observed
Gestational Age (Fetus A)

 LMP:           28w 2d        Date:  03/02/13                 EDD:   12/07/13
 Best:          28w 2d     Det. By:  LMP  (03/02/13)          EDD:   12/07/13
Doppler - Fetal Vessels (Fetus A)

 Umbilical Artery
 S/D:   4.43           95  %tile       RI:
 PI:    1.35                           PSV:       42.43   cm/s
Fetal Evaluation (Fetus B)

 Num Of Fetuses:    2
 Fetal Heart Rate:  136                          bpm
 Cardiac Activity:  Observed
 Fetal Lie:         Right Fetus
 Presentation:      Transverse, head to
                    maternal left

 Membrane Desc:     Dividing
                    Membrane seen
                    - Monochorionic

 Amniotic Fluid
 AFI FV:      Subjectively low-normal
                                             Larg Pckt:    3.38  cm
Biophysical Evaluation (Fetus B)

 Amniotic F.V:   Pocket => 2 cm two         F. Tone:        Observed
                 planes
 F. Movement:    Observed                   Score:          [DATE]
 F. Breathing:   Observed
Gestational Age (Fetus B)

 LMP:           28w 2d        Date:  03/02/13                 EDD:   12/07/13
 Best:          28w 2d     Det. By:  LMP  (03/02/13)          EDD:   12/07/13
Doppler - Fetal Vessels (Fetus B)

 Umbilical Artery
 S/D:   3.77           81  %tile

Impression

 Monochorionic/diamniotic twin pregnancy at 28+2 weeks
 Cephalic/transverse position
 Twin B  with lagging growth (AC < 3rd %tile)

 Twin A - normal UA Dopplers for gestational age
 Normal amniotic fluid volume (MVP 4.8 cm)

 Twin B - UA Dopplers elevated for gestational [AGE]th
 %tile); no AEDF or REDF
 Subjectively low amniotic fluid volume (MVP 3.8 cm)

 BPP [DATE] x 2
Recommendations

 Follow-up weekly ultrasounds for BPPs and UA dopplers
 Growth US next week.

 questions or concerns.

## 2013-09-16 NOTE — Progress Notes (Signed)
Laretta Pyatt  was seen today for an ultrasound appointment.  See full report in [facility].  Impression: Monochorionic/diamniotic twin pregnancy at 28+2 weeks Cephalic/transverse position Twin B  with lagging growth (AC < 3rd %tile)  Twin A - normal UA Dopplers for gestational age  Normal amniotic fluid volume (MVP 4.8 cm)  Twin B - UA Dopplers elevated for gestational age (95th %tile); no AEDF or REDF Subjectively low amniotic fluid volume (MVP 3.8 cm)  BPP 8/8 x 2  Recommendations: Follow-up weekly ultrasounds for BPPs and UA dopplers Growth [place] next week.  Alpha Gula, MD

## 2013-09-22 ENCOUNTER — Other Ambulatory Visit (HOSPITAL_COMMUNITY): Payer: Self-pay | Admitting: Obstetrics and Gynecology

## 2013-09-22 DIAGNOSIS — O30009 Twin pregnancy, unspecified number of placenta and unspecified number of amniotic sacs, unspecified trimester: Secondary | ICD-10-CM

## 2013-09-23 ENCOUNTER — Ambulatory Visit (HOSPITAL_COMMUNITY)
Admission: RE | Admit: 2013-09-23 | Discharge: 2013-09-23 | Disposition: A | Discharge status: Home / Self Care | Payer: Medicaid Other | Source: Ambulatory Visit | Attending: Obstetrics and Gynecology | Admitting: Obstetrics and Gynecology

## 2013-09-23 ENCOUNTER — Other Ambulatory Visit (HOSPITAL_COMMUNITY): Payer: Self-pay | Admitting: Obstetrics and Gynecology

## 2013-09-23 DIAGNOSIS — O09299 Supervision of pregnancy with other poor reproductive or obstetric history, unspecified trimester: Secondary | ICD-10-CM | POA: Insufficient documentation

## 2013-09-23 DIAGNOSIS — O365990 Maternal care for other known or suspected poor fetal growth, unspecified trimester, not applicable or unspecified: Secondary | ICD-10-CM | POA: Insufficient documentation

## 2013-09-23 DIAGNOSIS — O30009 Twin pregnancy, unspecified number of placenta and unspecified number of amniotic sacs, unspecified trimester: Secondary | ICD-10-CM | POA: Insufficient documentation

## 2013-09-23 IMAGING — US US UA ADDL GEST RE-EVAL
1 series · 14 of 16 positions shown · non-contrast
Comparison: none

[Series 1: us ua addl gest re-eval · 0.28mm/px · 14 of 63 slices shown]
[im 1/63]
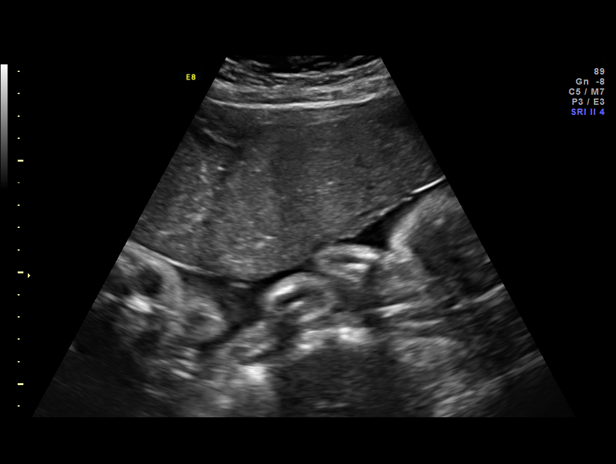
[im 5/63]
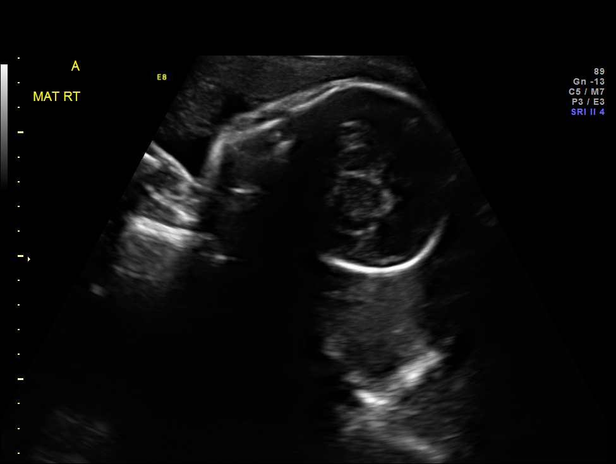
[im 9/63]
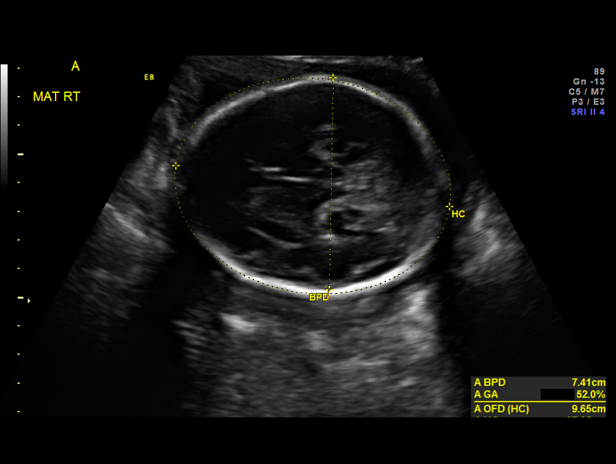
[im 17/63]
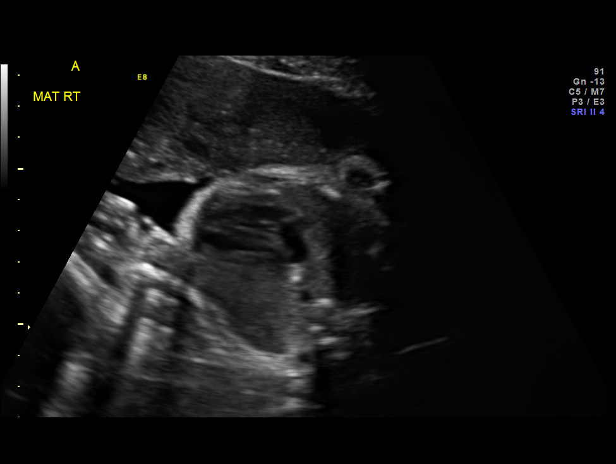
[im 21/63]
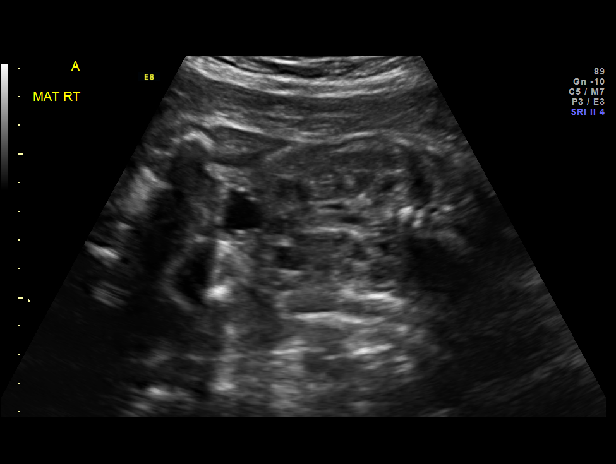
[im 25/63]
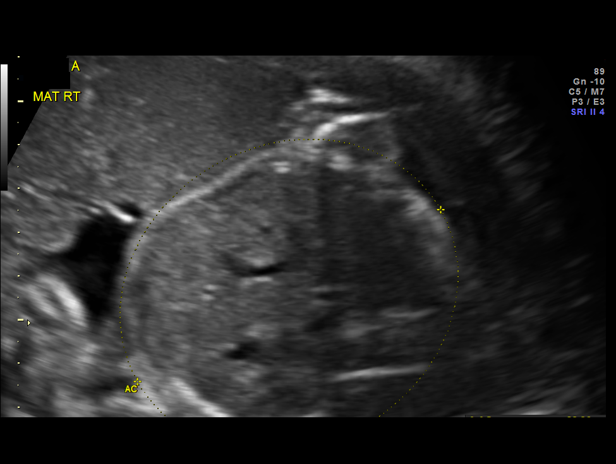
[im 29/63]
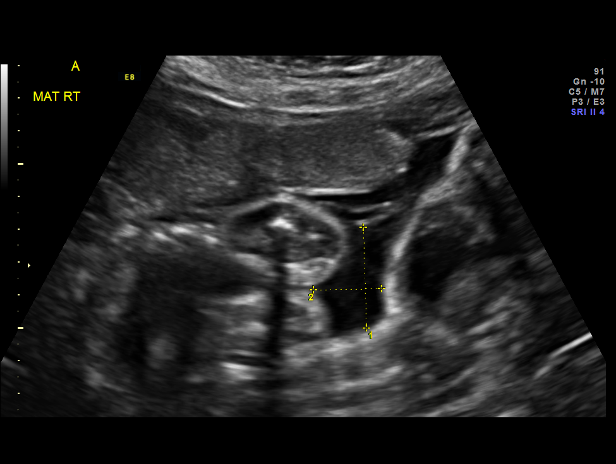
[im 34/63]
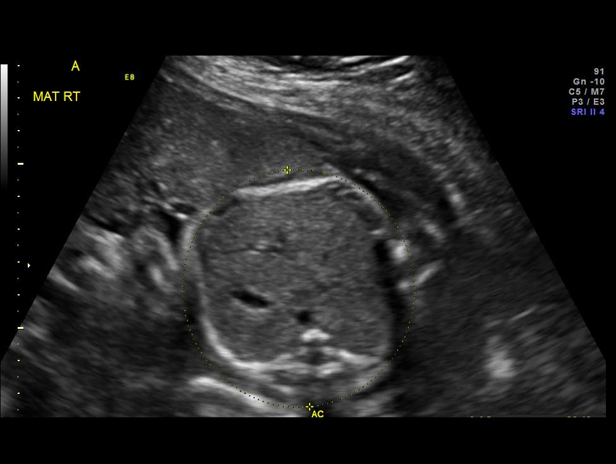
[im 38/63]
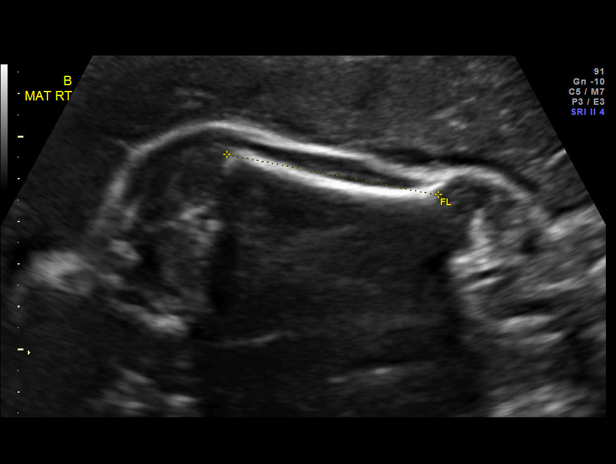
[im 42/63]
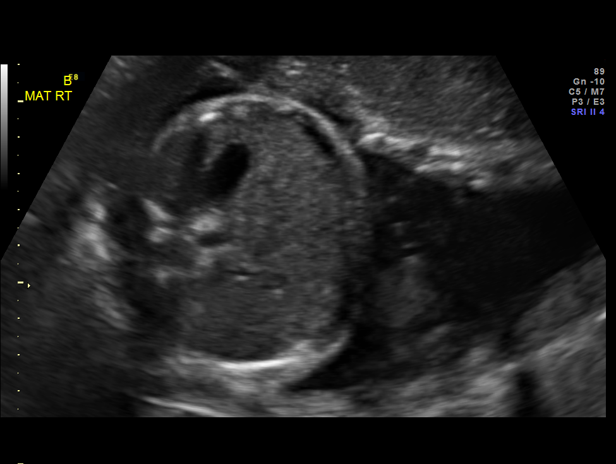
[im 50/63]
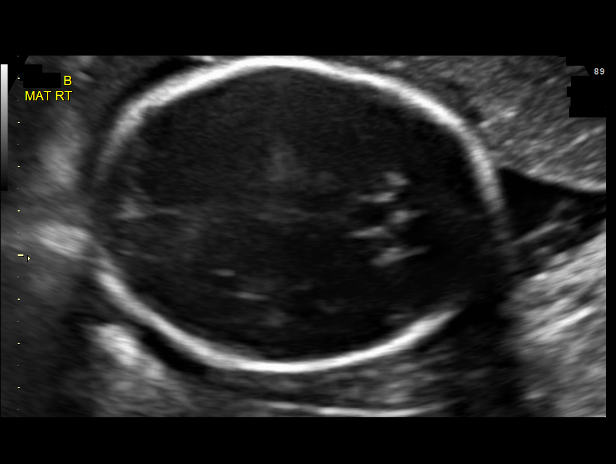
[im 54/63]
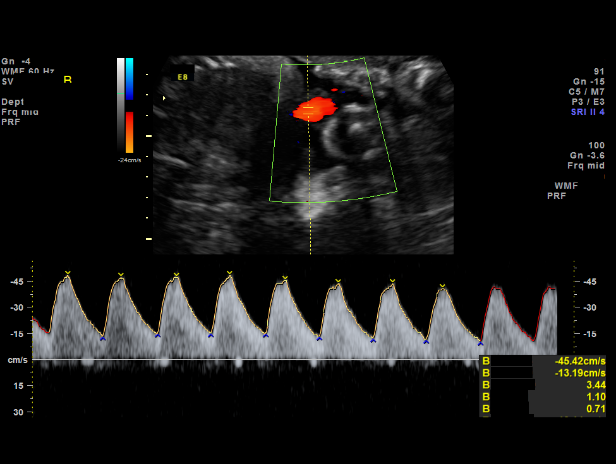
[im 58/63]
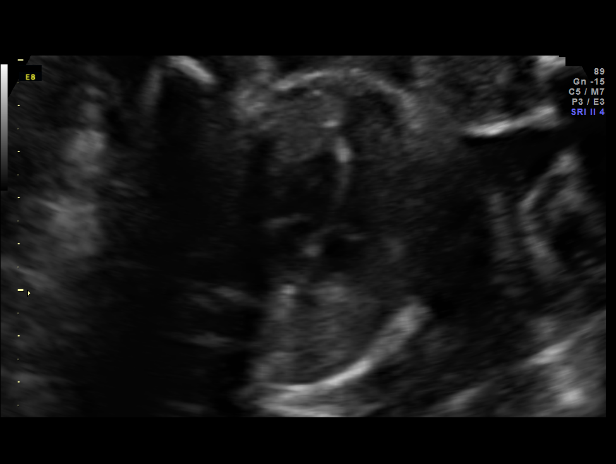
[im 63/63]
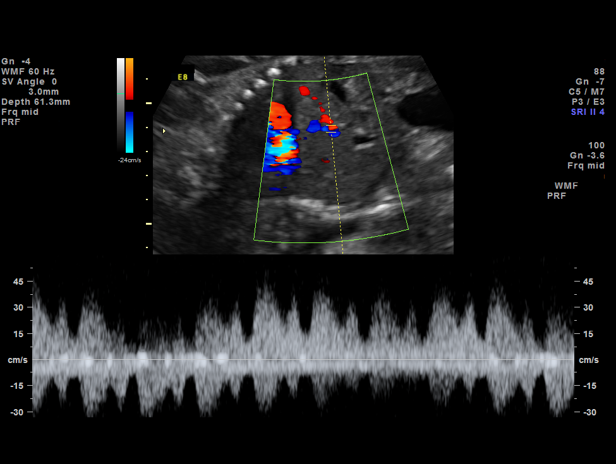

[14 of 16 positions shown; findings below may reference images not displayed]

OBSTETRICS REPORT
                      (Signed Final 09/23/2013 [DATE])

Service(s) Provided

 US UA DOPPLER RE-EVAL                                 76828.1
 US UA DOPPLER ADDL GEST RE EVAL                       76828.2
 US OB FOLLOW UP                                       76816.1
 US OB FOLLOW UP ADDL GEST                             76816.2
Indications

 Twin gestation, [place]
 Poor obstetric history: Previous gestational HTN
 Twin B: (presenting twin) growth restriction (AC <
 3rd %tile)
Fetal Evaluation (Fetus A)

 Num Of Fetuses:    2
 Fetal Heart Rate:  145                          bpm
 Cardiac Activity:  Observed
 Fetal Lie:         Left Fetus
 Presentation:      Cephalic
 Placenta:          Anterior, above cervical os
 P. Cord            Previously Visualized
 Insertion:

 Membrane Desc:     Dividing
                    Membrane seen
                    - Monochorionic

 Amniotic Fluid
 AFI FV:      Subjectively within normal limits
                                             Larg Pckt:     3.1  cm
Biophysical Evaluation (Fetus A)

 Amniotic F.V:   Within normal limits       F. Tone:        Observed
 F. Movement:    Observed                   Score:          [DATE]
 F. Breathing:   Observed
Biometry (Fetus A)

 BPD:     73.8  mm     G. Age:  29w 4d                CI:         78.8   70 - 86
 OFD:     93.6  mm                                    FL/HC:      18.9   19.6 -

 HC:     267.4  mm     G. Age:  29w 1d       15  %    HC/AC:      1.15   0.99 -

 AC:     232.6  mm     G. Age:  27w 4d        7  %    FL/BPD:     68.4   71 - 87
 FL:      50.5  mm     G. Age:  27w 1d      < 3  %    FL/AC:      21.7   20 - 24
 HUM:     45.1  mm     G. Age:  26w 5d      < 5  %

 Est. FW:    2220  gm      2 lb 7 oz     23  %     FW Discordancy         3  %
Gestational Age (Fetus A)

 LMP:           29w 2d        Date:  03/02/13                 EDD:   12/07/13
 U/S Today:     28w 2d                                        EDD:   12/14/13
 Best:          29w 2d     Det. By:  LMP  (03/02/13)          EDD:   12/07/13
Anatomy (Fetus A)

 Cranium:          Previously seen        Aortic Arch:      Previously seen
 Fetal Cavum:      Previously seen        Ductal Arch:      Previously seen
 Ventricles:       Appears normal         Diaphragm:        Appears normal
 Choroid Plexus:   Previously seen        Stomach:          Appears normal, left
                                                            sided
 Cerebellum:       Previously seen        Abdomen:          Previously seen
 Posterior Fossa:  Previously seen        Abdominal Wall:   Previously seen
 Nuchal Fold:      Not applicable (>20    Cord Vessels:     Previously seen
                   wks GA)
 Face:             Orbits and profile     Kidneys:          Appear normal
                   previously seen
 Lips:             Previously seen        Bladder:          Appears normal
 Heart:            Appears normal         Spine:            Previously seen
                   (4CH, axis, and
                   situs)
 RVOT:             Previously seen        Lower             Previously seen
                                          Extremities:
 LVOT:             Previously seen        Upper             Previously seen
                                          Extremities:

 Other:  Female gender. Heels previously seen.
Doppler - Fetal Vessels (Fetus A)

 Umbilical Artery
 S/D:   3.52           76  %tile       RI:
 PI:    1.15                           PSV:       40.94   cm/s

Fetal Evaluation (Fetus B)

 Num Of Fetuses:    2
 Fetal Heart Rate:  161                          bpm
 Cardiac Activity:  Observed
 Fetal Lie:         Right Fetus
 Presentation:      Transverse, head to
                    maternal right
 Placenta:          Anterior, above cervical os
 P. Cord            Previously Visualized
 Insertion:

 Membrane Desc:     Dividing
                    Membrane seen
                    - Monochorionic
 Amniotic Fluid
 AFI FV:      Subjectively low-normal
                                             Larg Pckt:     4.3  cm
Biophysical Evaluation (Fetus B)

 Amniotic F.V:   Within normal limits       F. Tone:        Observed
 F. Movement:    Observed                   Score:          [DATE]
 F. Breathing:   Observed
Biometry (Fetus B)

 BPD:     68.5  mm     G. Age:  27w 4d                CI:         72.6   70 - 86
 OFD:     94.3  mm                                    FL/HC:      19.4   19.6 -

 HC:     260.5  mm     G. Age:  28w 2d        5  %    HC/AC:      1.07   0.99 -

 AC:     242.7  mm     G. Age:  28w 4d       23  %    FL/BPD:     73.7   71 - 87
 FL:      50.5  mm     G. Age:  27w 1d      < 3  %    FL/AC:      20.8   20 - 24
 HUM:     45.1  mm     G. Age:  26w 5d      < 5  %

 Est. FW:    4468  gm      2 lb 9 oz     26  %     FW Discordancy      0 \ 3 %
Gestational Age (Fetus B)

 LMP:           29w 2d        Date:  03/02/13                 EDD:   12/07/13
 U/S Today:     27w 6d                                        EDD:   12/17/13
 Best:          29w 2d     Det. By:  LMP  (03/02/13)          EDD:   12/07/13
Anatomy (Fetus B)

 Cranium:          Previously seen        Aortic Arch:      Previously seen
 Fetal Cavum:      Previously seen        Ductal Arch:      Previously seen
 Ventricles:       Appears normal         Diaphragm:        Appears normal
 Choroid Plexus:   Previously seen        Stomach:          Appears normal, left
                                                            sided
 Cerebellum:       Previously seen        Abdomen:          Previously seen
 Posterior Fossa:  Previously seen        Abdominal Wall:   Previously seen
 Nuchal Fold:      Not applicable (>20    Cord Vessels:     Previously seen
                   wks GA)
 Face:             Orbits and profile     Kidneys:          Appear normal
                   previously seen
 Lips:             Previously seen        Bladder:          Appears normal
 Heart:            Appears normal         Spine:            Previously seen
                   (4CH, axis, and
                   situs)
 RVOT:             Previously seen        Lower             Previously seen
                                          Extremities:
 LVOT:             Previously seen        Upper             Previously seen
                                          Extremities:

 Other:  Female gender.
Doppler - Fetal Vessels (Fetus B)

 Umbilical Artery
 S/D:   3.81           88  %tile       RI:
 PI:    1.21                           PSV:       45.42   cm/s
Cervix Uterus Adnexa

 Cervical Length:    3.1      cm

 Cervix:       Normal appearance by transabdominal scan. Appears
               closed, without funnelling.
Impression

 Monochorionic/diamniotic twin pregnancy at 29+2 weeks

 Twin A
 Maternal left, cephalic presentation
 Overall fetal growth is appropriate (EFW at the 23rd %tile).
 The AC measures at the 7th %tile (lagging growth)
 Normal UA Dopplers for gestational age
 BPP [DATE]
 Normal amniotic fluid volume

 Twin B
 Maternal right, transverse presentation
 Overal fetal growth is appropriate (EFW 26th %tile).
 Normal UA Dopplers for gestational age
 BPP [DATE]
 Normal amniotic fluid volume
Recommendations

 Follow-up weekly ultrasounds for BPPs and UA dopplers
 Growth US in 3 weeks

 questions or concerns.

## 2013-09-23 NOTE — Progress Notes (Signed)
Allison Russell  was seen today for an ultrasound appointment.  See full report in [facility].  Impression: Monochorionic/diamniotic twin pregnancy at 29+2 weeks  Twin A  Maternal left, cephalic presentation Overall fetal growth is appropriate (EFW at the 23rd %tile).  [facility] measures at the 7th %tile (lagging growth) Normal UA Dopplers for gestational age  BPP 8/8 Normal amniotic fluid volume   Twin B Maternal right, transverse presentation Overal fetal growth is appropriate (EFW 26th %tile).   Normal UA Dopplers for gestational age BPP 8/8 Normal amniotic fluid volume  Recommendations: Follow-up weekly ultrasounds for BPPs and UA dopplers Growth [place] in 3 weeks  Alpha Gula, MD

## 2013-09-29 ENCOUNTER — Other Ambulatory Visit (HOSPITAL_COMMUNITY): Payer: Self-pay | Admitting: Obstetrics and Gynecology

## 2013-09-29 DIAGNOSIS — O30009 Twin pregnancy, unspecified number of placenta and unspecified number of amniotic sacs, unspecified trimester: Secondary | ICD-10-CM

## 2013-09-30 ENCOUNTER — Ambulatory Visit (HOSPITAL_COMMUNITY)
Admission: RE | Admit: 2013-09-30 | Discharge: 2013-09-30 | Disposition: A | Discharge status: Home / Self Care | Payer: Medicaid Other | Source: Ambulatory Visit | Attending: Obstetrics and Gynecology | Admitting: Obstetrics and Gynecology

## 2013-09-30 DIAGNOSIS — Z2989 Encounter for other specified prophylactic measures: Secondary | ICD-10-CM | POA: Insufficient documentation

## 2013-09-30 DIAGNOSIS — A6000 Herpesviral infection of urogenital system, unspecified: Secondary | ICD-10-CM | POA: Insufficient documentation

## 2013-09-30 DIAGNOSIS — Z298 Encounter for other specified prophylactic measures: Secondary | ICD-10-CM | POA: Insufficient documentation

## 2013-09-30 DIAGNOSIS — O365990 Maternal care for other known or suspected poor fetal growth, unspecified trimester, not applicable or unspecified: Secondary | ICD-10-CM | POA: Insufficient documentation

## 2013-09-30 DIAGNOSIS — O09299 Supervision of pregnancy with other poor reproductive or obstetric history, unspecified trimester: Secondary | ICD-10-CM | POA: Insufficient documentation

## 2013-09-30 DIAGNOSIS — O30009 Twin pregnancy, unspecified number of placenta and unspecified number of amniotic sacs, unspecified trimester: Secondary | ICD-10-CM | POA: Insufficient documentation

## 2013-09-30 DIAGNOSIS — O98519 Other viral diseases complicating pregnancy, unspecified trimester: Secondary | ICD-10-CM | POA: Insufficient documentation

## 2013-09-30 DIAGNOSIS — O360990 Maternal care for other rhesus isoimmunization, unspecified trimester, not applicable or unspecified: Secondary | ICD-10-CM | POA: Insufficient documentation

## 2013-09-30 IMAGING — US US UA CORD DOPPLER
2 series · 13 of 27 positions shown · non-contrast
Comparison: none

[Series 1: us fetal bpp w/o nonstress · non-contrast · 25 acquisitions, 12 frames shown (1 of 2)]
[im 2/25]
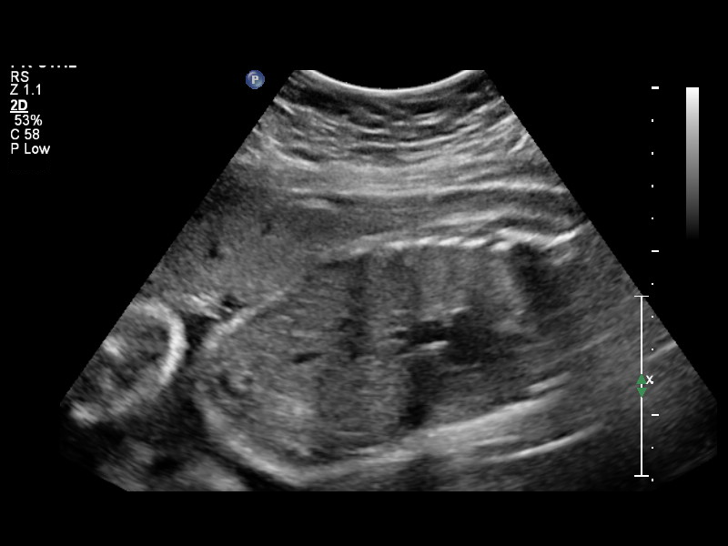
[im 4/25]
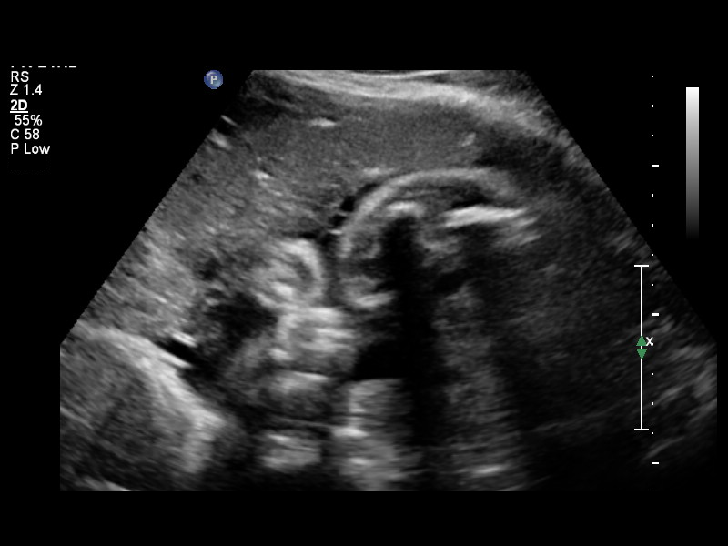
[im 6/25]
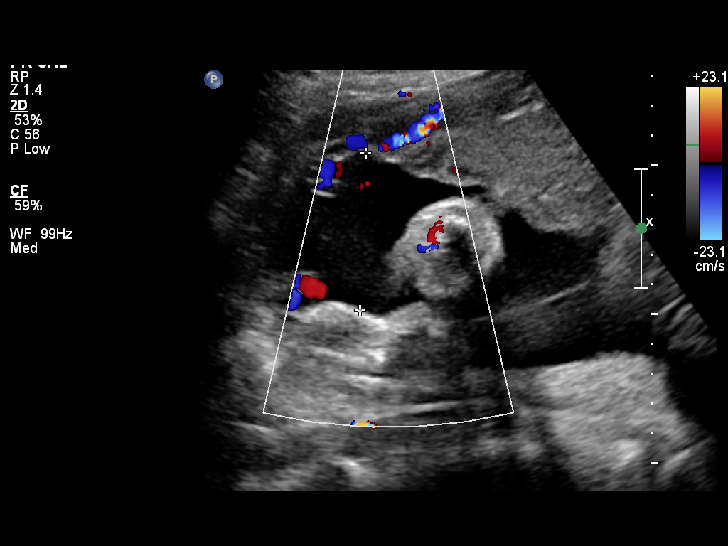
[im 8/25]
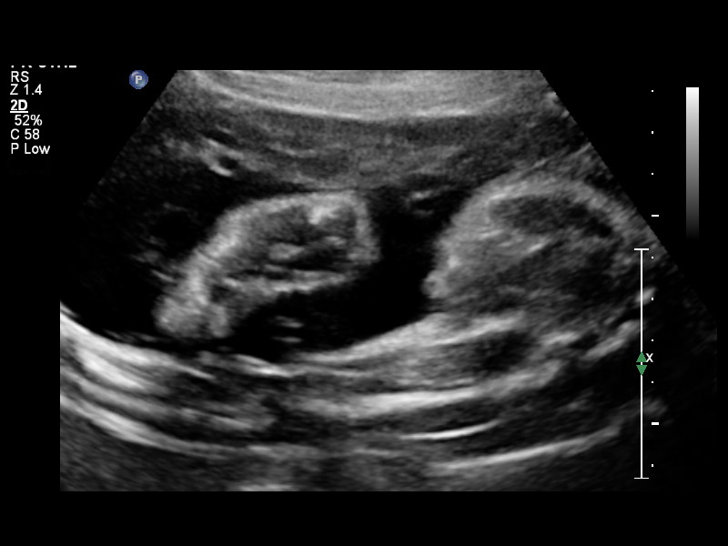
[im 10/25]
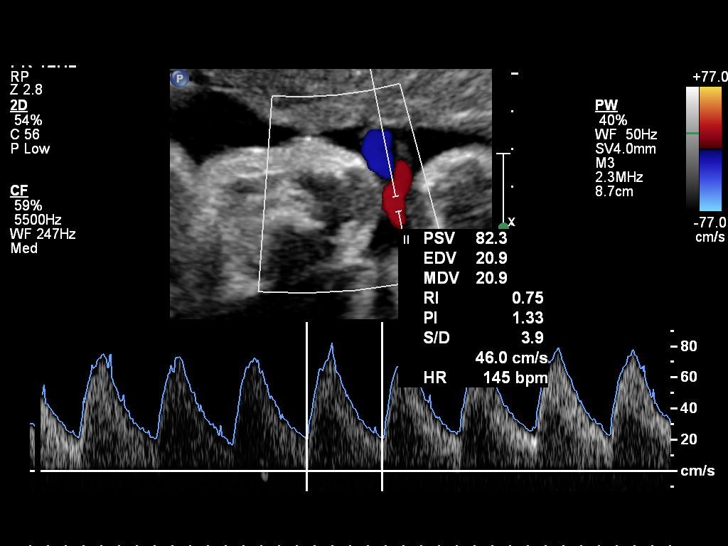
[im 12/25]
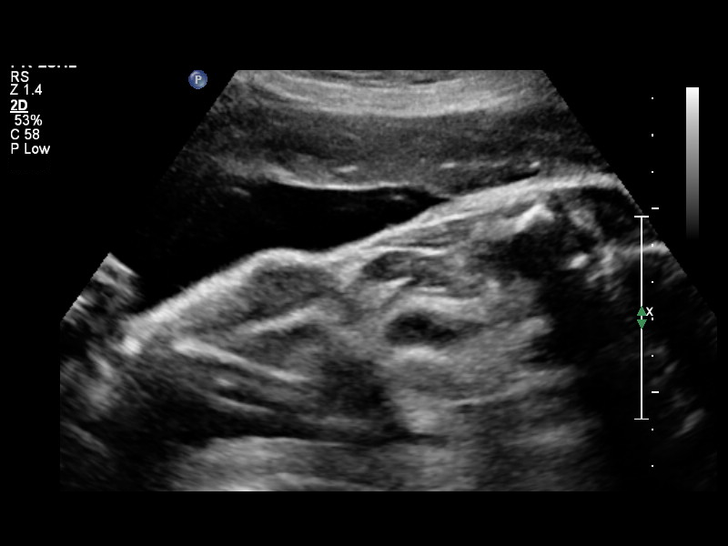
[im 14/25]
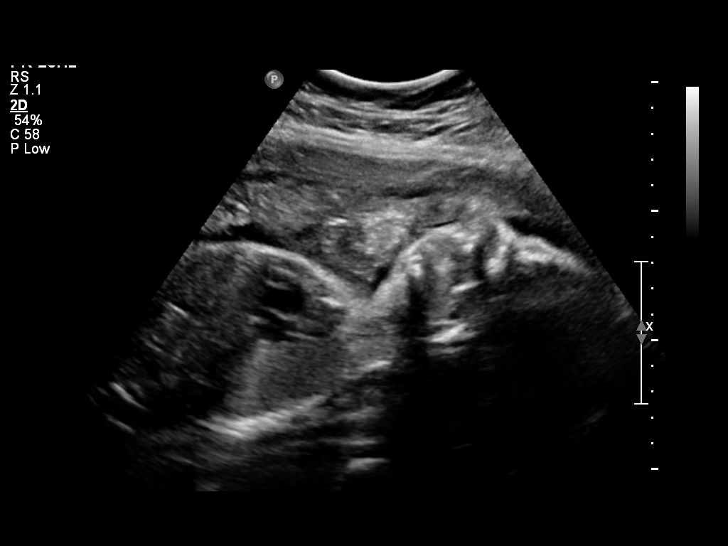
[im 16/25]
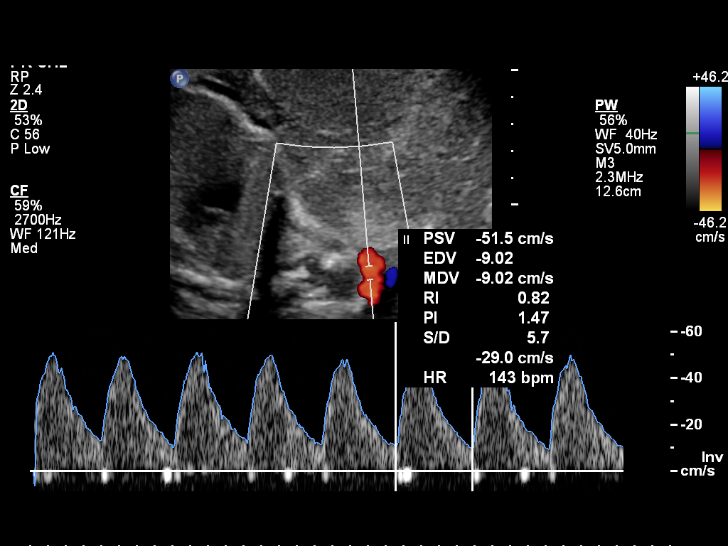
[im 18/25]
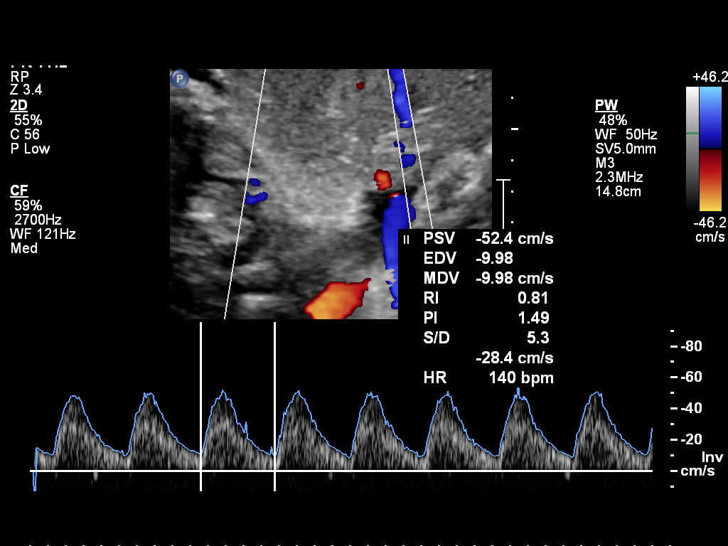
[im 20/25]
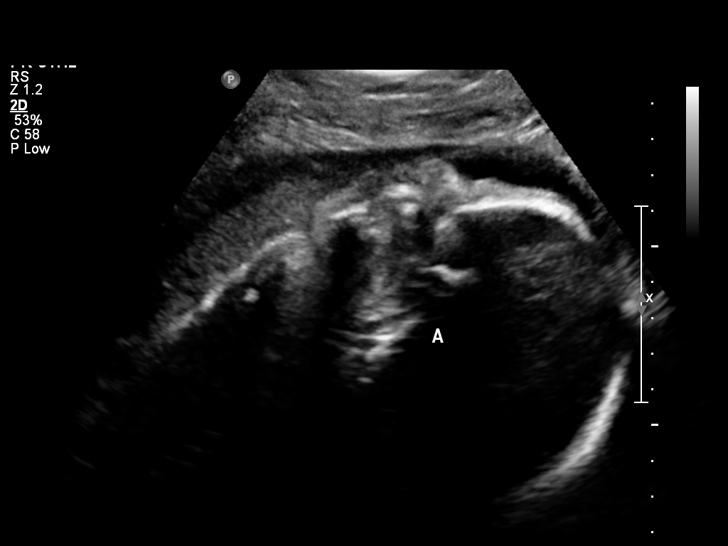
[im 22/25]
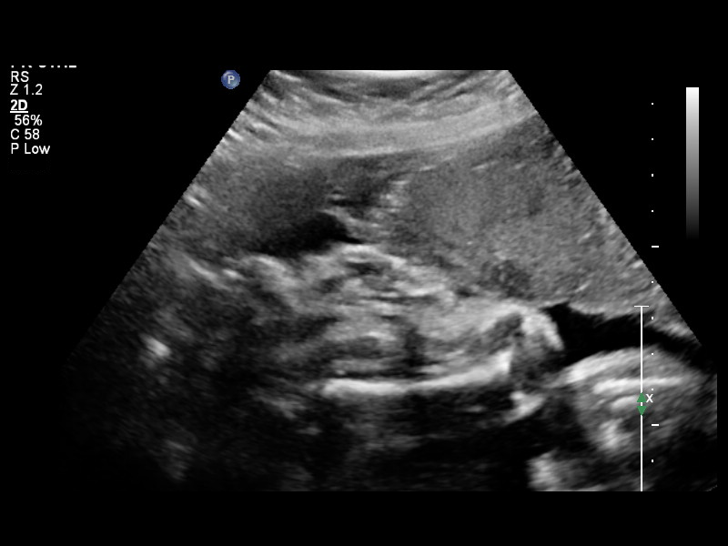
[im 24/25]
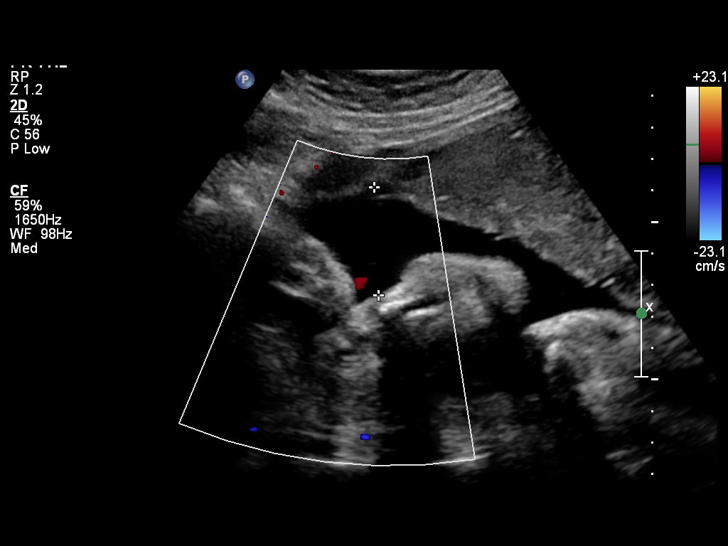

[Series 1: us fetal bpp w/o nonstress · non-contrast · 1 of 2 slices shown (2 of 2)]
[im 1/2]
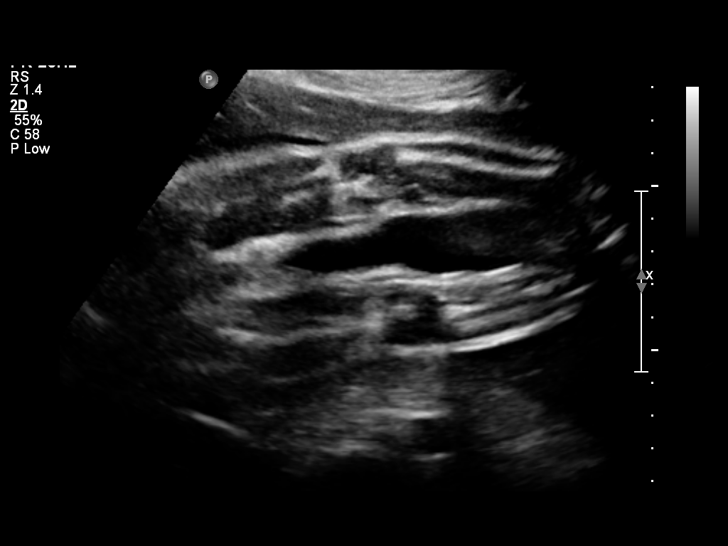

[13 of 27 positions shown; findings below may reference images not displayed]

OBSTETRICS REPORT
                      (Signed Final 09/30/2013 [DATE])

Service(s) Provided

 US UA CORD DOPPLER                                    76820.0
 US UA ADDL GEST                                       76820.1
Indications

 Twin gestation, [place]
 Poor obstetric history: Previous gestational HTN
 Twin B: (presenting twin) growth restriction (AC <
 3rd %tile)
 Rh negative
 Herpes simplex virus ([place])
Fetal Evaluation (Fetus A)

 Num Of Fetuses:    2
 Fetal Heart Rate:  139                          bpm
 Cardiac Activity:  Observed
 Fetal Lie:         Maternal left side
 Presentation:      Cephalic

 Membrane Desc:     Dividing
                    Membrane seen
                    - Monochorionic

 Amniotic Fluid
 AFI FV:      Subjectively within normal limits
                                             Larg Pckt:     5.3  cm
Biophysical Evaluation (Fetus A)

 Amniotic F.V:   Pocket => 2 cm two         F. Tone:        Observed
                 planes
 F. Movement:    Observed                   Score:          [DATE]
 F. Breathing:   Observed
Gestational Age (Fetus A)

 LMP:           30w 2d        Date:  03/02/13                 EDD:   12/07/13
 Best:          30w 2d     Det. By:  LMP  (03/02/13)          EDD:   12/07/13
Doppler - Fetal Vessels (Fetus A)
 Umbilical Artery
 S/D:   4.3            97  %tile
 Umbilical Artery
 Absent DFV:    No     Reverse DFV:    No

Fetal Evaluation (Fetus B)

 Num Of Fetuses:    2
 Fetal Heart Rate:  140                          bpm
 Cardiac Activity:  Observed
 Fetal Lie:         Maternal right side
 Presentation:      Cephalic

 Membrane Desc:     Dividing
                    Membrane seen
                    - Monochorionic

 Amniotic Fluid
 AFI FV:      Subjectively within normal limits
                                             Larg Pckt:     3.4  cm
Biophysical Evaluation (Fetus B)

 Amniotic F.V:   Pocket => 2 cm two         F. Tone:        Observed
                 planes
 F. Movement:    Observed                   Score:          [DATE]
 F. Breathing:   Observed
Gestational Age (Fetus B)

 LMP:           30w 2d        Date:  03/02/13                 EDD:   12/07/13
 Best:          30w 2d     Det. By:  LMP  (03/02/13)          EDD:   12/07/13
Doppler - Fetal Vessels (Fetus B)

 Umbilical Artery
 S/D:   4.7        > 97.5  %tile
 Umbilical Artery
 Absent DFV:    No     Reverse DFV:    No

Impression

 Monochorionic/diamniotic twin pregnancy at 30+2 weeks
 Normal amniotic fluid volume x 2
 UA dopplers were high normal for twin A and elevated in twin
 B; continuous diastolic flow
 BPP [DATE] x 2
Recommendations

 Continue weekly BPPs and UA dopplers

 questions or concerns.

## 2013-10-05 ENCOUNTER — Other Ambulatory Visit (HOSPITAL_COMMUNITY): Payer: Self-pay | Admitting: Obstetrics and Gynecology

## 2013-10-05 DIAGNOSIS — O30009 Twin pregnancy, unspecified number of placenta and unspecified number of amniotic sacs, unspecified trimester: Secondary | ICD-10-CM

## 2013-10-07 ENCOUNTER — Ambulatory Visit (HOSPITAL_COMMUNITY): Payer: Medicaid Other

## 2013-10-09 ENCOUNTER — Encounter (HOSPITAL_COMMUNITY): Payer: Self-pay

## 2013-10-09 ENCOUNTER — Ambulatory Visit (HOSPITAL_COMMUNITY)
Admission: RE | Admit: 2013-10-09 | Discharge: 2013-10-09 | Disposition: A | Discharge status: Home / Self Care | Payer: Medicaid Other | Source: Ambulatory Visit | Attending: Obstetrics and Gynecology | Admitting: Obstetrics and Gynecology

## 2013-10-09 DIAGNOSIS — O09299 Supervision of pregnancy with other poor reproductive or obstetric history, unspecified trimester: Secondary | ICD-10-CM | POA: Insufficient documentation

## 2013-10-09 DIAGNOSIS — A6000 Herpesviral infection of urogenital system, unspecified: Secondary | ICD-10-CM | POA: Insufficient documentation

## 2013-10-09 DIAGNOSIS — O30009 Twin pregnancy, unspecified number of placenta and unspecified number of amniotic sacs, unspecified trimester: Secondary | ICD-10-CM | POA: Insufficient documentation

## 2013-10-09 DIAGNOSIS — O98519 Other viral diseases complicating pregnancy, unspecified trimester: Secondary | ICD-10-CM | POA: Insufficient documentation

## 2013-10-09 DIAGNOSIS — O365990 Maternal care for other known or suspected poor fetal growth, unspecified trimester, not applicable or unspecified: Secondary | ICD-10-CM | POA: Insufficient documentation

## 2013-10-09 DIAGNOSIS — O360990 Maternal care for other rhesus isoimmunization, unspecified trimester, not applicable or unspecified: Secondary | ICD-10-CM | POA: Insufficient documentation

## 2013-10-09 IMAGING — US US UA ADDL GEST
1 series · 12 of 16 positions shown · non-contrast
Comparison: none

[Series 1: us ua addl gest · 0.23mm/px · 12 of 20 slices shown]
[im 1/20]
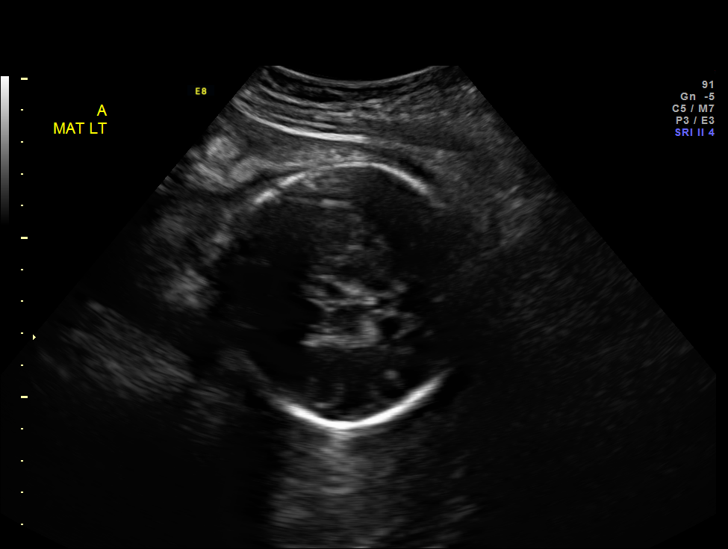
[im 3/20]
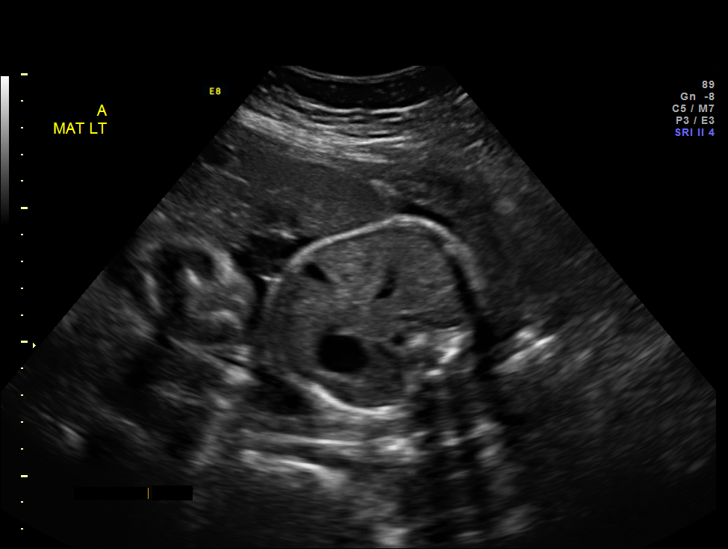
[im 4/20]
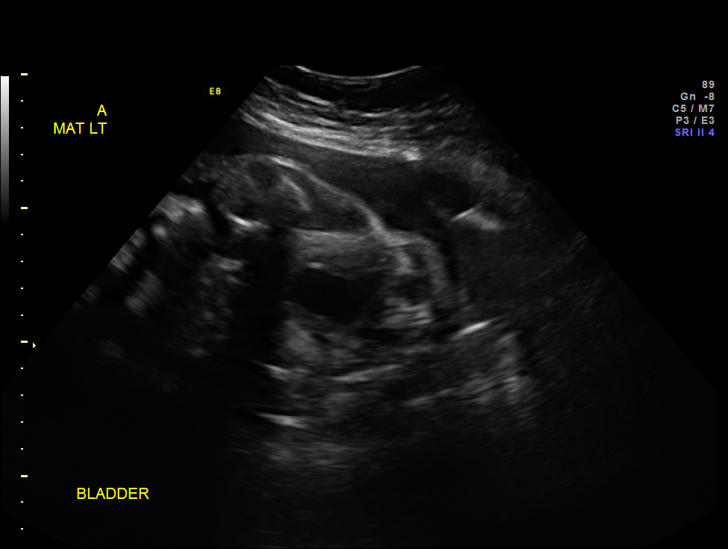
[im 6/20]
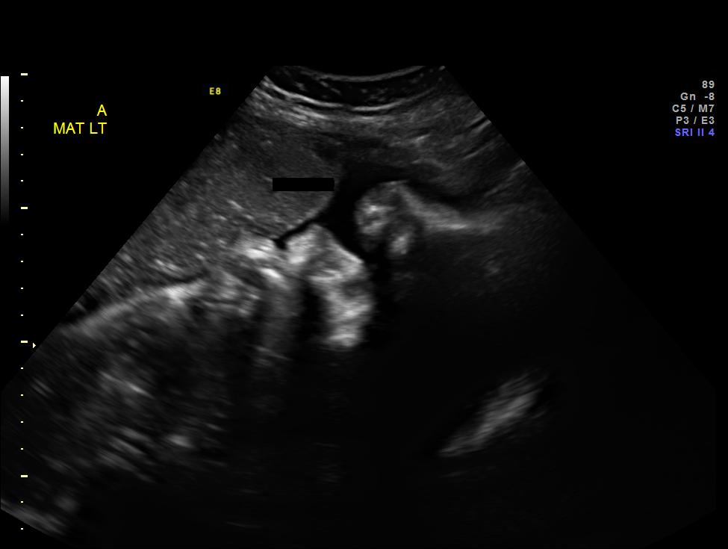
[im 8/20]
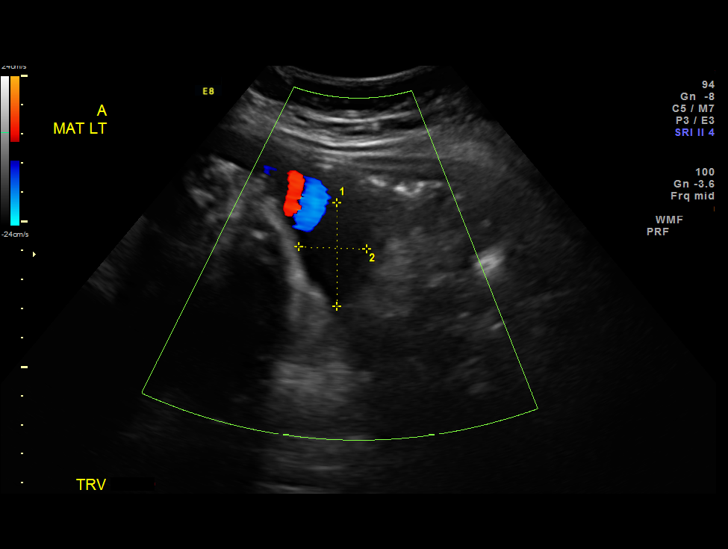
[im 9/20]
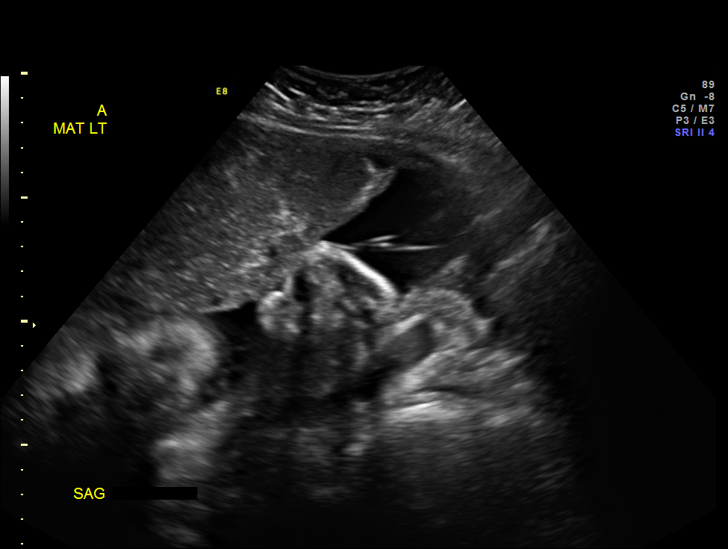
[im 11/20]
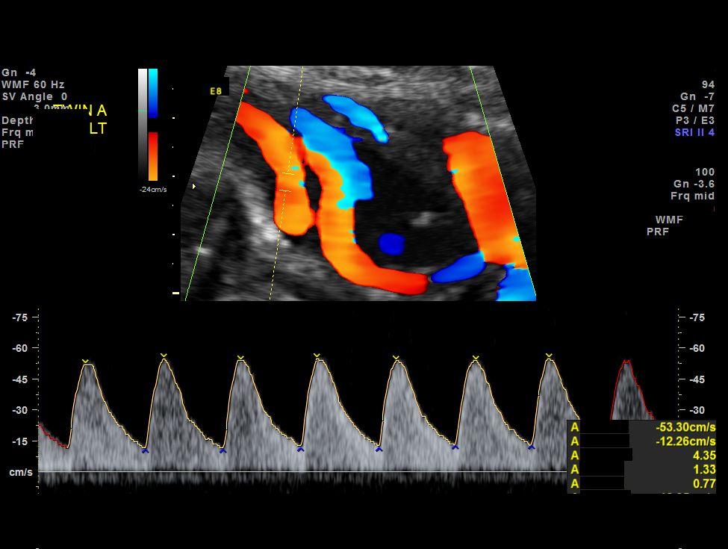
[im 13/20]
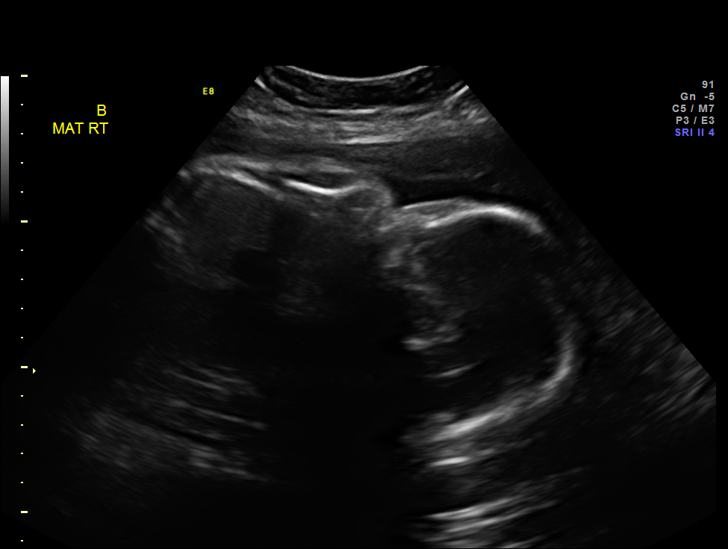
[im 14/20]
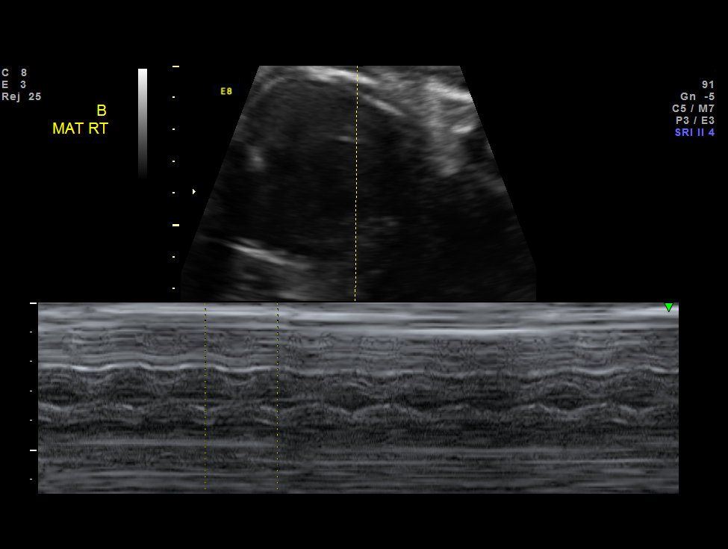
[im 16/20]
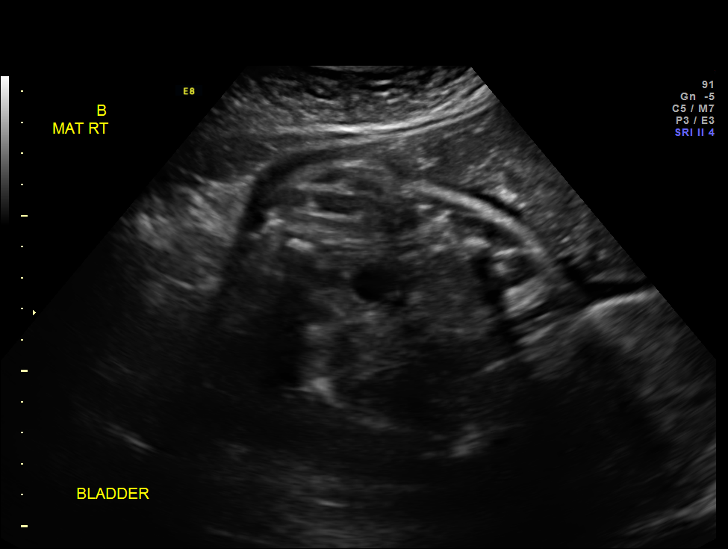
[im 18/20]
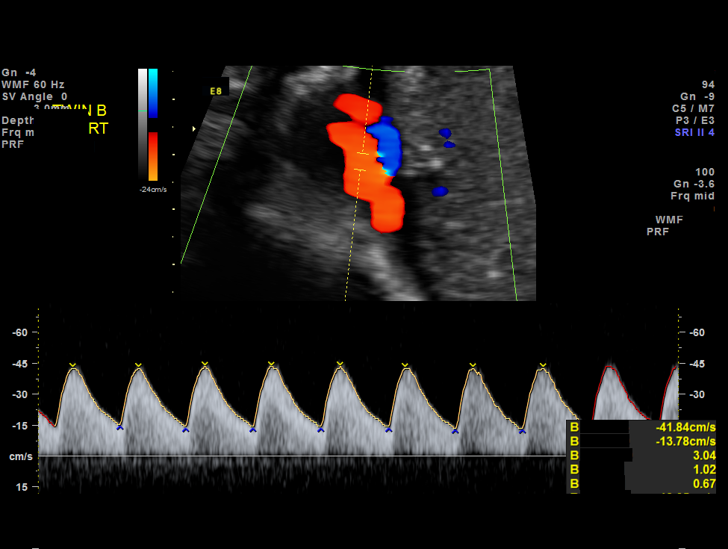
[im 20/20]
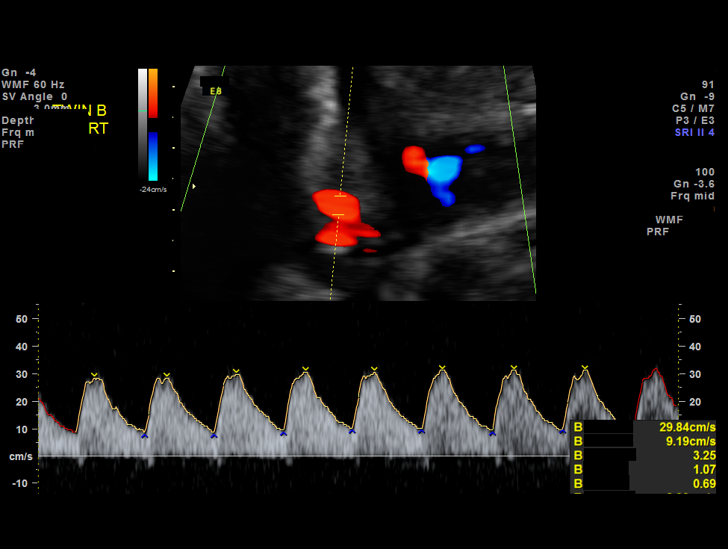

[12 of 16 positions shown; findings below may reference images not displayed]

OBSTETRICS REPORT
                    (Corrected Final 12/01/2013 [DATE])

Service(s) Provided

 US UA CORD DOPPLER                                    76820.0
 US UA ADDL GEST                                       76820.1
Indications

 Twin gestation, [place]
 Poor obstetric history: Previous gestational HTN
 Twin B: (presenting twin) growth restriction (AC <
 3rd %tile)
 Rh negative
 Herpes simplex virus ([place])
Fetal Evaluation (Fetus A)

 Num Of Fetuses:    2
 Fetal Heart Rate:  134                          bpm
 Cardiac Activity:  Observed
 Fetal Lie:         Left Fetus
 Presentation:      Cephalic
 Placenta:          Anterior, above cervical os
 P. Cord            Previously Visualized
 Insertion:

 Membrane Desc:     Dividing
                    Membrane seen
                    - Monochorionic

 Amniotic Fluid
 AFI FV:      Subjectively within normal limits
                                             Larg Pckt:     3.5  cm
Biophysical Evaluation (Fetus A)

 Amniotic F.V:   Pocket => 2 cm two         F. Tone:        Observed
                 planes
 F. Movement:    Observed                   Score:          [DATE]
 F. Breathing:   Observed
Gestational Age (Fetus A)

 LMP:           31w 4d        Date:  03/02/13                 EDD:   12/07/13
 Best:          31w 4d     Det. By:  LMP  (03/02/13)          EDD:   12/07/13
Doppler - Fetal Vessels (Fetus A)

 Umbilical Artery
 S/D:   3.9            94  %tile       RI:
 PI:    1.26                           PSV:       53.3    cm/s
 Umbilical Artery
 Absent DFV:    No     Reverse DFV:    No

Fetal Evaluation (Fetus B)

 Num Of Fetuses:    2
 Fetal Heart Rate:  139                          bpm
 Cardiac Activity:  Observed
 Fetal Lie:         Right Fetus
 Presentation:      Cephalic
 Placenta:          Anterior, above cervical os
 P. Cord            Previously Visualized
 Insertion:

 Membrane Desc:     Dividing
                    Membrane seen
                    - Monochorionic

 Amniotic Fluid
 AFI FV:      Subjectively within normal limits
                                             Larg Pckt:     4.8  cm
Biophysical Evaluation (Fetus B)

 Amniotic F.V:   Pocket => 2 cm two         F. Tone:        Observed
                 planes
 F. Movement:    Observed                   Score:          [DATE]
 F. Breathing:   Observed
Gestational Age (Fetus B)

 LMP:           31w 4d        Date:  03/02/13                 EDD:   12/07/13
 Best:          31w 4d     Det. By:  LMP  (03/02/13)          EDD:   12/07/13
Doppler - Fetal Vessels (Fetus B)

 Umbilical Artery
 S/D:   3.19           71  %tile       RI:
 PI:    1.05                           PSV:       41.84   cm/s
 Umbilical Artery
 Absent DFV:    No     Reverse DFV:    No

Impression

 Monochorionic/diamniotic twin pregnancy at 31+4 weeks

 Twin A
 Maternal left, cephalic presentation
 Normal UA Dopplers for gestational age (at approximately
 +1SD above the mean)
 Normal amniotic fluid volume
 Normally-filled bladder
 BPP [DATE]

 Twin B
 Maternal right,cephalic presentation
 Normal UA Dopplers for gestational age
 Normally-filled bladder
 Normal amniotic fluid volume
 BPP [DATE]
Recommendations

 Every 1 week ultrasounds for AFI, Doppler, and looking for
 evidence of TTTS
 Interval growth due in 2-3 weeks
 Begin antenatal testing at 32 weeks ([place] beginning next
 week)
 Deliver between 36 and 37 weeks, assuming Dopplers, AFI,
 and testing remains reassuring and no evidence of TTTS is
 ever demonstrated

 questions or concerns.
                 Attending Physician, LORRAINE

## 2013-10-14 ENCOUNTER — Other Ambulatory Visit (HOSPITAL_COMMUNITY): Payer: Self-pay | Admitting: Obstetrics and Gynecology

## 2013-10-14 ENCOUNTER — Ambulatory Visit (HOSPITAL_COMMUNITY): Payer: Medicaid Other

## 2013-10-14 DIAGNOSIS — O30009 Twin pregnancy, unspecified number of placenta and unspecified number of amniotic sacs, unspecified trimester: Secondary | ICD-10-CM

## 2013-10-15 ENCOUNTER — Encounter (HOSPITAL_COMMUNITY): Payer: Self-pay

## 2013-10-15 ENCOUNTER — Ambulatory Visit (HOSPITAL_COMMUNITY)
Admission: RE | Admit: 2013-10-15 | Discharge: 2013-10-15 | Disposition: A | Discharge status: Home / Self Care | Payer: Medicaid Other | Source: Ambulatory Visit | Attending: Obstetrics and Gynecology | Admitting: Obstetrics and Gynecology

## 2013-10-15 ENCOUNTER — Other Ambulatory Visit (HOSPITAL_COMMUNITY): Payer: Self-pay | Admitting: Obstetrics and Gynecology

## 2013-10-15 DIAGNOSIS — O30009 Twin pregnancy, unspecified number of placenta and unspecified number of amniotic sacs, unspecified trimester: Secondary | ICD-10-CM

## 2013-10-15 DIAGNOSIS — A6000 Herpesviral infection of urogenital system, unspecified: Secondary | ICD-10-CM | POA: Insufficient documentation

## 2013-10-15 DIAGNOSIS — O09299 Supervision of pregnancy with other poor reproductive or obstetric history, unspecified trimester: Secondary | ICD-10-CM | POA: Insufficient documentation

## 2013-10-15 DIAGNOSIS — O365990 Maternal care for other known or suspected poor fetal growth, unspecified trimester, not applicable or unspecified: Secondary | ICD-10-CM | POA: Insufficient documentation

## 2013-10-15 DIAGNOSIS — O98519 Other viral diseases complicating pregnancy, unspecified trimester: Secondary | ICD-10-CM | POA: Insufficient documentation

## 2013-10-15 DIAGNOSIS — O360990 Maternal care for other rhesus isoimmunization, unspecified trimester, not applicable or unspecified: Secondary | ICD-10-CM | POA: Insufficient documentation

## 2013-10-15 IMAGING — US US OB FOLLOW-UP EACH ADDL GEST (MODIFY)
1 series · 14 of 28 positions shown · non-contrast
Comparison: none

[Series 1: us ob follow-up each addl gest (modify) · 0.26mm/px · 14 of 66 slices shown]
[im 3/66]
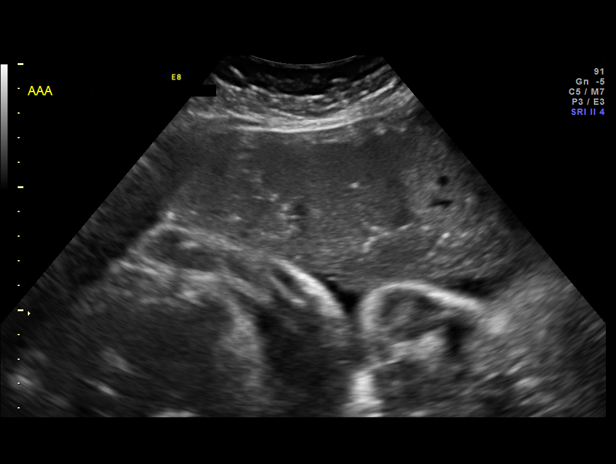
[im 8/66]
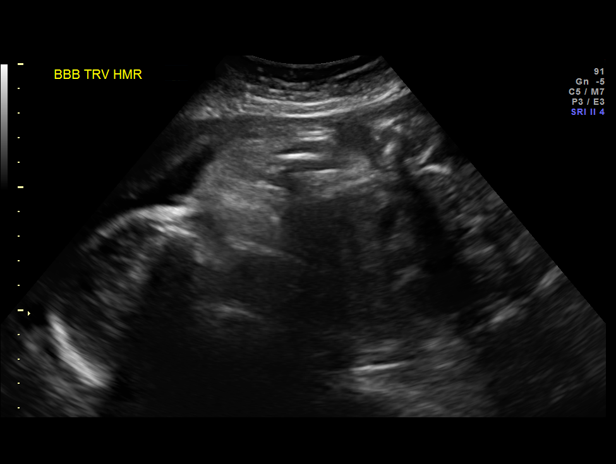
[im 13/66]
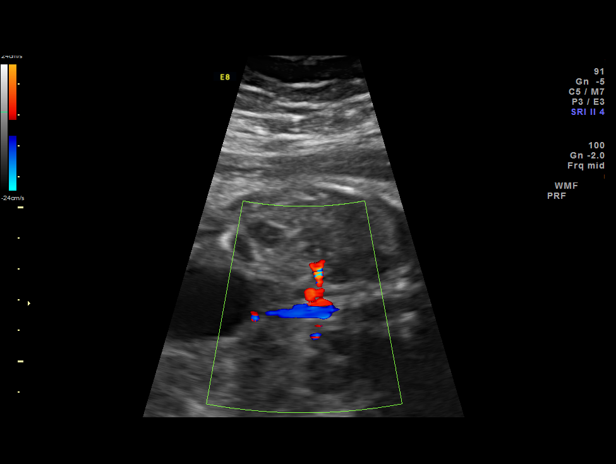
[im 17/66]
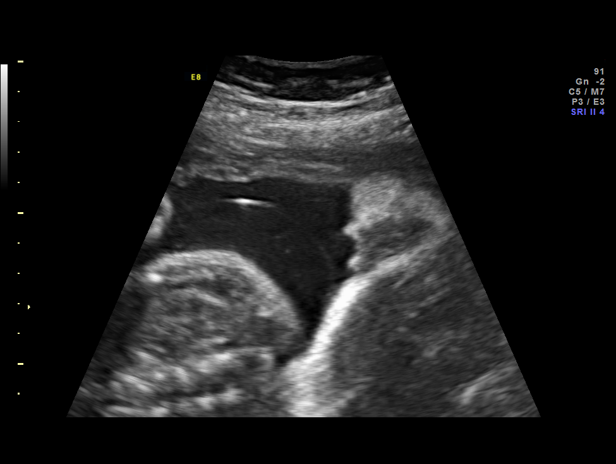
[im 22/66]
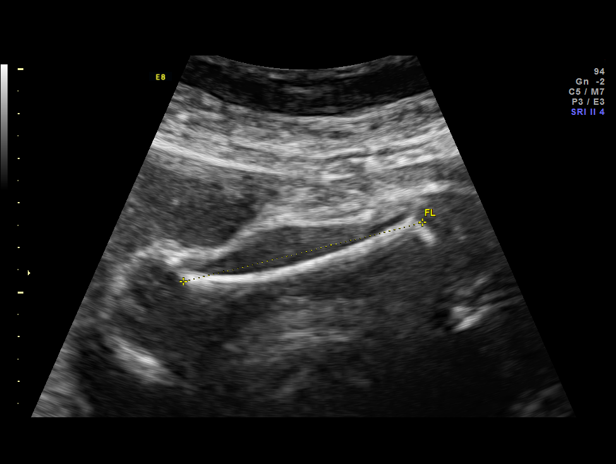
[im 27/66]
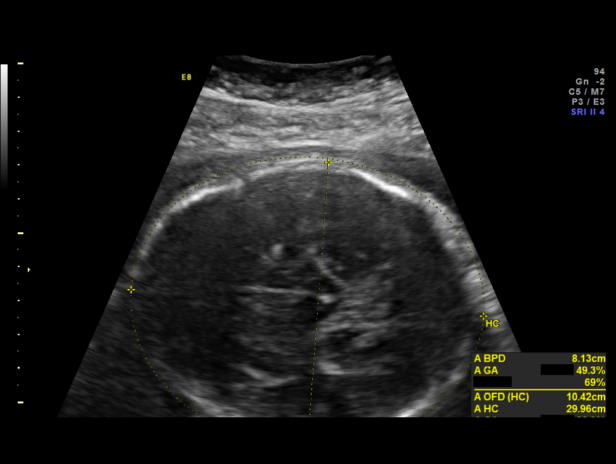
[im 32/66]
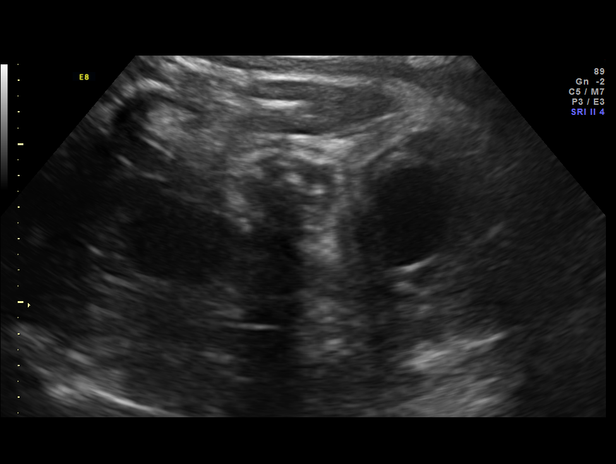
[im 37/66]
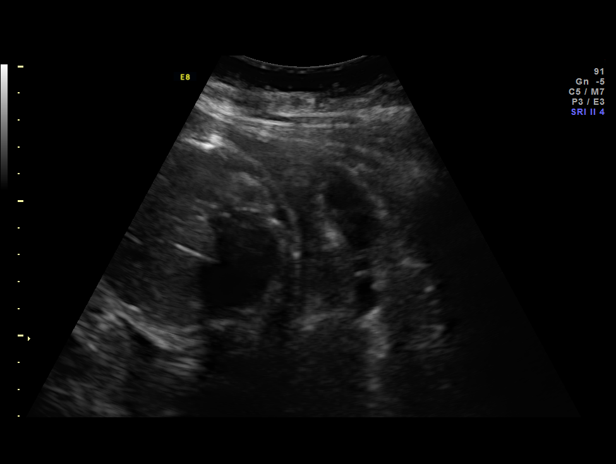
[im 41/66]
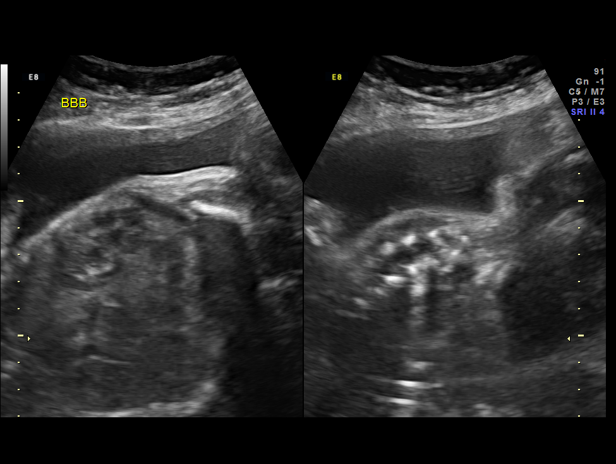
[im 46/66]
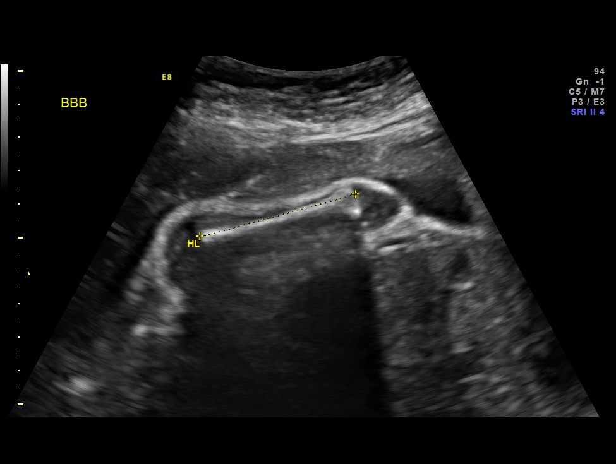
[im 51/66]
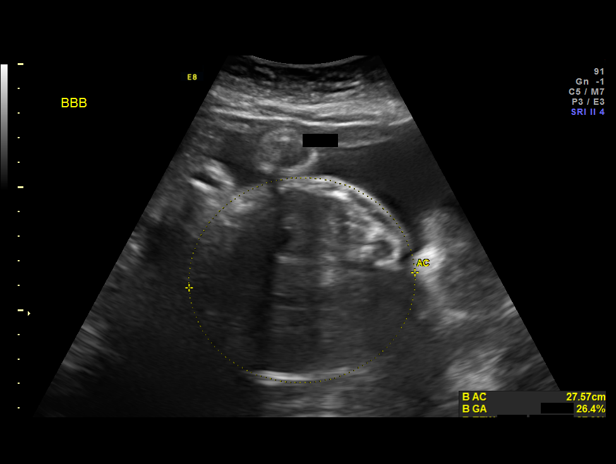
[im 56/66]
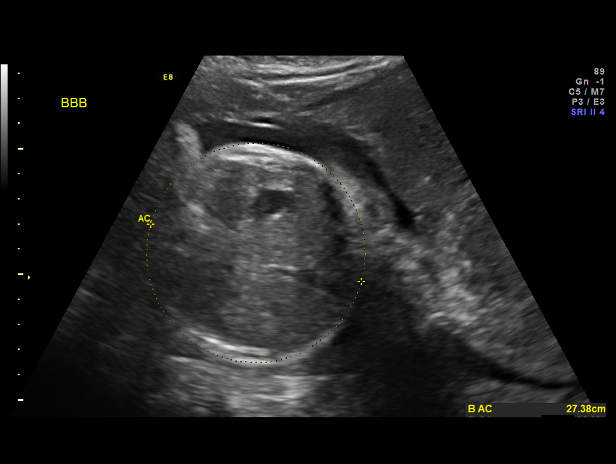
[im 61/66]
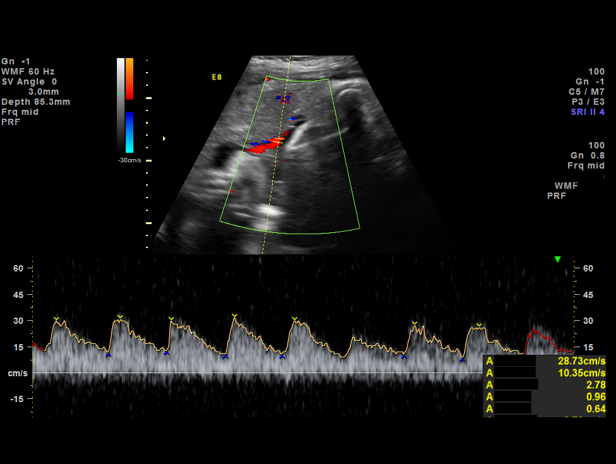
[im 66/66]
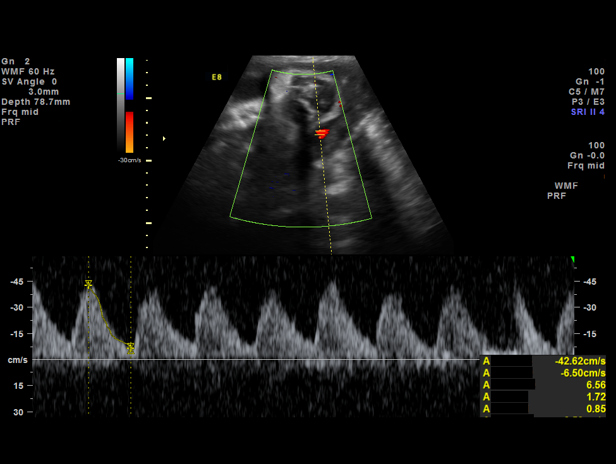

[14 of 28 positions shown; findings below may reference images not displayed]

OBSTETRICS REPORT
                      (Signed Final 10/16/2013 [DATE])

Service(s) Provided

 US OB FOLLOW UP                                       76816.1
 US OB FOLLOW UP ADDL GEST                             76816.2
 US UA CORD DOPPLER                                    76820.0
 US UA ADDL GEST                                       76820.1
Indications

 Twin gestation, [place]
 Poor obstetric history: Previous gestational HTN
 Twin B: (presenting twin) growth restriction (AC <
 3rd %tile)
 Rh negative
 Herpes simplex virus ([place])
Fetal Evaluation (Fetus A)

 Num Of Fetuses:    2
 Fetal Heart Rate:  145                          bpm
 Cardiac Activity:  Observed
 Fetal Lie:         Left Fetus
 Presentation:      Cephalic
 Placenta:          Anterior, above cervical os
 P. Cord            Previously Visualized
 Insertion:

 Membrane Desc:     Dividing
                    Membrane seen
                    - Monochorionic

 Amniotic Fluid
 AFI FV:      Subjectively within normal limits
                                             Larg Pckt:     5.8  cm
Biophysical Evaluation (Fetus A)

 Amniotic F.V:   Within normal limits       F. Tone:        Observed
 F. Movement:    Observed                   Score:          [DATE]
 F. Breathing:   Observed
Biometry (Fetus A)

 BPD:     81.6  mm     G. Age:  32w 6d                CI:         79.1   70 - 86
 OFD:    103.1  mm                                    FL/HC:      18.7   19.1 -

 HC:     298.3  mm     G. Age:  33w 0d       28  %    HC/AC:      1.16   0.96 -

 AC:     257.1  mm     G. Age:  29w 6d      < 3  %    FL/BPD:     68.3   71 - 87
 FL:      55.7  mm     G. Age:  29w 2d      < 3  %    FL/AC:      21.7   20 - 24
 HUM:     49.8  mm     G. Age:  29w 2d      < 5  %

 Est. FW:    8678  gm      3 lb 6 oz     19  %     FW Discordancy         5  %
Gestational Age (Fetus A)

 LMP:           32w 3d        Date:  03/02/13                 EDD:   12/07/13
 U/S Today:     31w 2d                                        EDD:   12/15/13
 Best:          32w 3d     Det. By:  LMP  (03/02/13)          EDD:   12/07/13
Anatomy (Fetus A)

 Cranium:          Appears normal         Aortic Arch:      Previously seen
 Fetal Cavum:      Previously seen        Ductal Arch:      Previously seen
 Ventricles:       Appears normal         Diaphragm:        Appears normal
 Choroid Plexus:   Previously seen        Stomach:          Appears normal, left
                                                            sided
 Cerebellum:       Previously seen        Abdomen:          Previously seen
 Posterior Fossa:  Previously seen        Abdominal Wall:   Previously seen
 Nuchal Fold:      Not applicable (>20    Cord Vessels:     Previously seen
                   wks GA)
 Face:             Orbits and profile     Kidneys:          Appear normal
                   previously seen
 Lips:             Previously seen        Bladder:          Appears normal
 Heart:            Previously seen        Spine:            Previously seen
 RVOT:             Previously seen        Lower             Previously seen
                                          Extremities:
 LVOT:             Previously seen        Upper             Previously seen
                                          Extremities:

 Other:  Female gender. Heels previously seen.
Doppler - Fetal Vessels (Fetus A)

 Umbilical Artery
 S/D:   4.4        > 97.5  %tile       RI:
 PI:    1.27                           PSV:       61.76   cm/s
 Umbilical Artery
 Absent DFV:    No     Reverse DFV:    No

Fetal Evaluation (Fetus B)

 Num Of Fetuses:    2
 Fetal Heart Rate:  156                          bpm
 Cardiac Activity:  Observed
 Fetal Lie:         Right Fetus
 Presentation:      Transverse, head to
                    maternal right
 Placenta:          Anterior, above cervical os
 P. Cord            Previously Visualized
 Insertion:

 Membrane Desc:     Dividing
                    Membrane seen
                    - Monochorionic
 Amniotic Fluid
 AFI FV:      Subjectively within normal limits
                                             Larg Pckt:     3.6  cm
Biophysical Evaluation (Fetus B)

 Amniotic F.V:   Within normal limits       F. Tone:        Observed
 F. Movement:    Observed                   Score:          [DATE]
 F. Breathing:   Observed
Biometry (Fetus B)

 BPD:     76.7  mm     G. Age:  30w 5d                CI:         76.3   70 - 86
 OFD:    100.5  mm                                    FL/HC:      19.4   19.1 -

 HC:     286.3  mm     G. Age:  31w 3d        4  %    HC/AC:      1.05   0.96 -

 AC:     272.9  mm     G. Age:  31w 3d       21  %    FL/BPD:     72.5   71 - 87
 FL:      55.6  mm     G. Age:  29w 2d      < 3  %    FL/AC:      20.4   20 - 24
 HUM:     48.4  mm     G. Age:  28w 3d      < 5  %

 Est. FW:    2928  gm      3 lb 9 oz     24  %     FW Discordancy      0 \ 5 %
Gestational Age (Fetus B)

 LMP:           32w 3d        Date:  03/02/13                 EDD:   12/07/13
 U/S Today:     30w 5d                                        EDD:   12/19/13
 Best:          32w 3d     Det. By:  LMP  (03/02/13)          EDD:   12/07/13
Anatomy (Fetus B)

 Cranium:          Appears normal         Aortic Arch:      Previously seen
 Fetal Cavum:      Appears normal         Ductal Arch:      Previously seen
 Ventricles:       Appears normal         Diaphragm:        Previously seen
 Choroid Plexus:   Previously seen        Stomach:          Appears normal, left
                                                            sided
 Cerebellum:       Previously seen        Abdomen:          Previously seen
 Posterior Fossa:  Previously seen        Abdominal Wall:   Previously seen
 Nuchal Fold:      Not applicable (>20    Cord Vessels:     Previously seen
                   wks GA)
 Face:             Orbits and profile     Kidneys:          Appear normal
                   previously seen
 Lips:             Previously seen        Bladder:          Appears normal
 Heart:            Previously seen        Spine:            Previously seen
 RVOT:             Previously seen        Lower             Previously seen
                                          Extremities:
 LVOT:             Previously seen        Upper             Previously seen
                                          Extremities:

 Other:  Female gender.
Doppler - Fetal Vessels (Fetus B)

 Umbilical Artery
 S/D:   2.75           55  %tile       RI:
 PI:    0.96                           PSV:       39.57   cm/s
 Umbilical Artery
 Absent DFV:    No     Reverse DFV:    No
Cervix Uterus Adnexa

 Cervix:       Not visualized (advanced GA >35wks)
 Uterus:       No abnormality visualized.
 Cul De Sac:   No free fluid seen.
 Left Ovary:    Not visualized.
 Right Ovary:   Not visualized.

 Adnexa:     No abnormality visualized.
Impression

 Monochorionic/diamniotic twin pregnancy at 32+2 weeks

 Twin A:
 Normal interval anatomy; anatomic survey complete
 Normal amniotic fluid volume
 EFW at the 19th %tile, AC < 3rd %tile
 UA dopplers were elevated for this GA

 Twin B:
 Normal interval anatomy; anatomic survey complete
 Normal amniotic fluid volume
 Appropriate interval growth with EFW at the 24th %tile, AC at
 the 21st %tile
 UA dopplers were normal for this GA

 BPPs [DATE]
Recommendations

 Continue weekly BPPs and UA dopplers (traveling from
 [HOSPITAL])
 Growth again in 3 weeks

 questions or concerns.

## 2013-10-16 ENCOUNTER — Ambulatory Visit (HOSPITAL_COMMUNITY): Payer: Medicaid Other

## 2013-10-19 ENCOUNTER — Ambulatory Visit (HOSPITAL_COMMUNITY): Payer: Medicaid Other

## 2013-10-19 ENCOUNTER — Other Ambulatory Visit (HOSPITAL_COMMUNITY): Payer: Medicaid Other

## 2013-10-21 ENCOUNTER — Ambulatory Visit (HOSPITAL_COMMUNITY): Payer: Medicaid Other

## 2013-10-22 ENCOUNTER — Ambulatory Visit (HOSPITAL_COMMUNITY)
Admission: RE | Admit: 2013-10-22 | Discharge: 2013-10-22 | Disposition: A | Discharge status: Home / Self Care | Payer: Medicaid Other | Source: Ambulatory Visit | Attending: Obstetrics and Gynecology | Admitting: Obstetrics and Gynecology

## 2013-10-22 ENCOUNTER — Encounter (HOSPITAL_COMMUNITY): Payer: Self-pay

## 2013-10-22 DIAGNOSIS — O09299 Supervision of pregnancy with other poor reproductive or obstetric history, unspecified trimester: Secondary | ICD-10-CM | POA: Insufficient documentation

## 2013-10-22 DIAGNOSIS — O365990 Maternal care for other known or suspected poor fetal growth, unspecified trimester, not applicable or unspecified: Secondary | ICD-10-CM | POA: Insufficient documentation

## 2013-10-22 DIAGNOSIS — O30009 Twin pregnancy, unspecified number of placenta and unspecified number of amniotic sacs, unspecified trimester: Secondary | ICD-10-CM | POA: Insufficient documentation

## 2013-10-22 DIAGNOSIS — O30039 Twin pregnancy, monochorionic/diamniotic, unspecified trimester: Secondary | ICD-10-CM | POA: Insufficient documentation

## 2013-10-22 IMAGING — US US UA CORD DOPPLER
1 series · 12 of 21 positions shown · non-contrast
Comparison: none

[Series 1: us ua cord doppler · 0.28mm/px · 21 acquisitions, 12 frames shown]
[im 1/21]
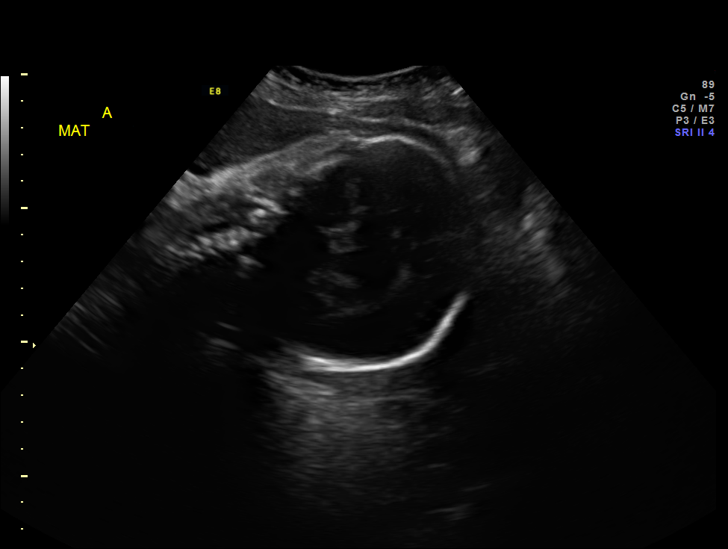
[im 3/21]
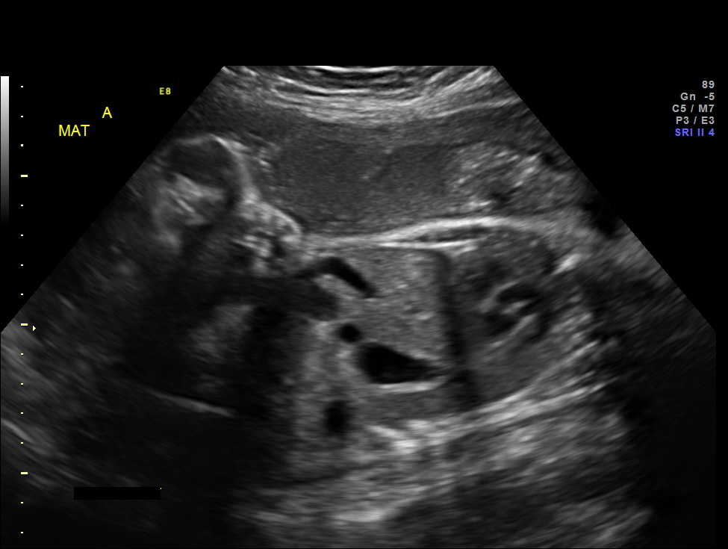
[im 5/21]
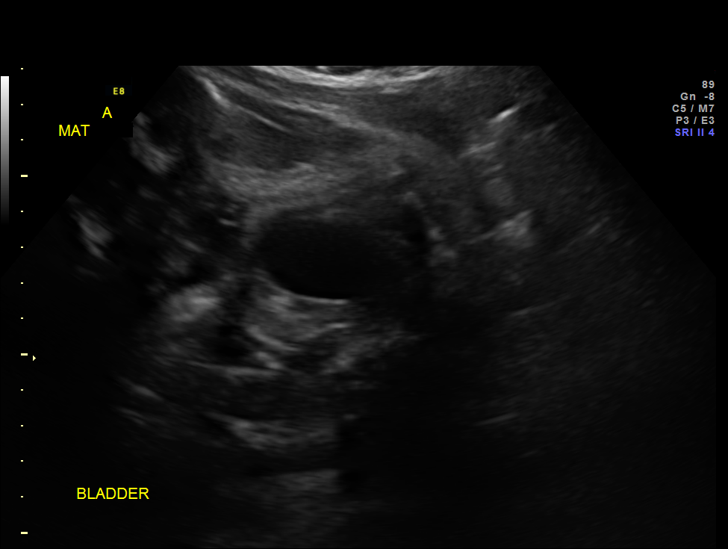
[im 7/21]
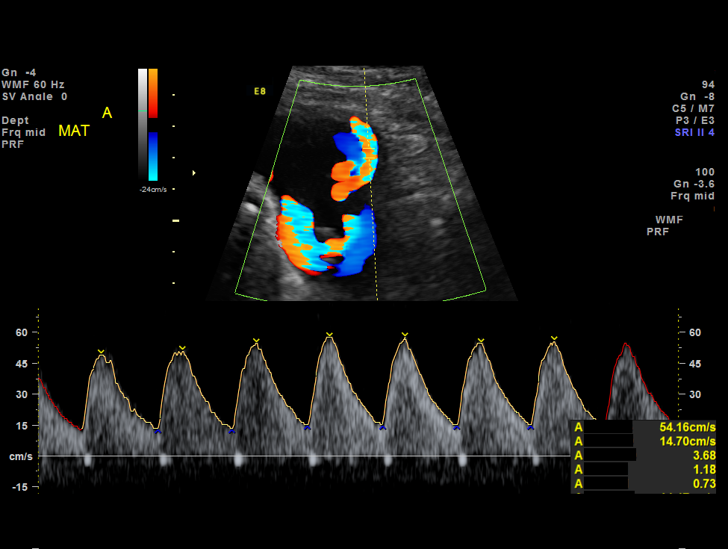
[im 8/21]
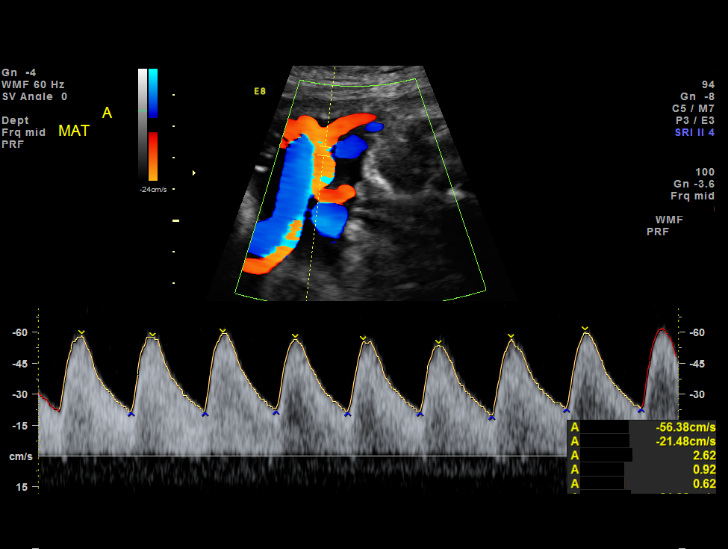
[im 10/21]
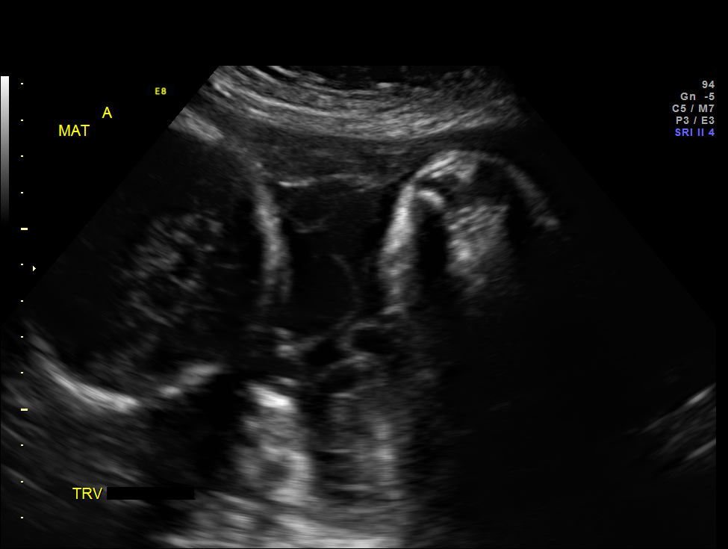
[im 12/21]
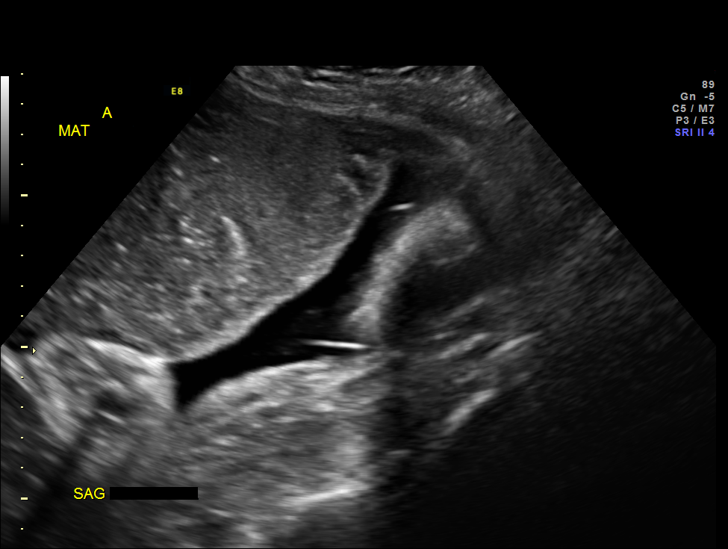
[im 14/21]
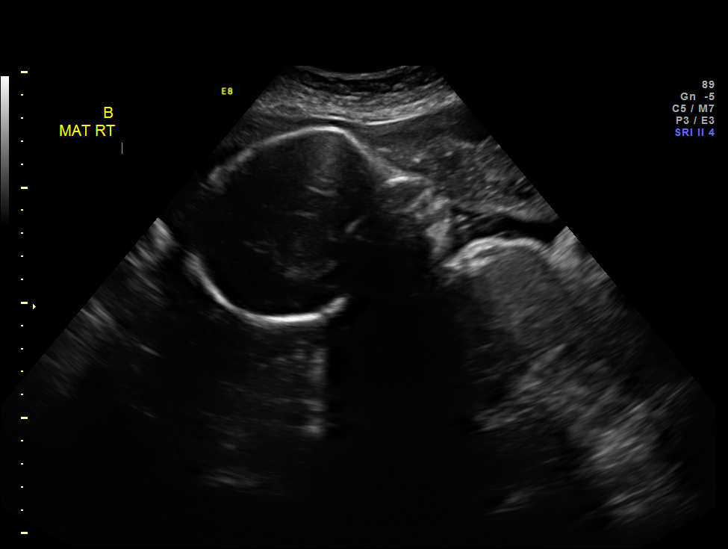
[im 15/21]
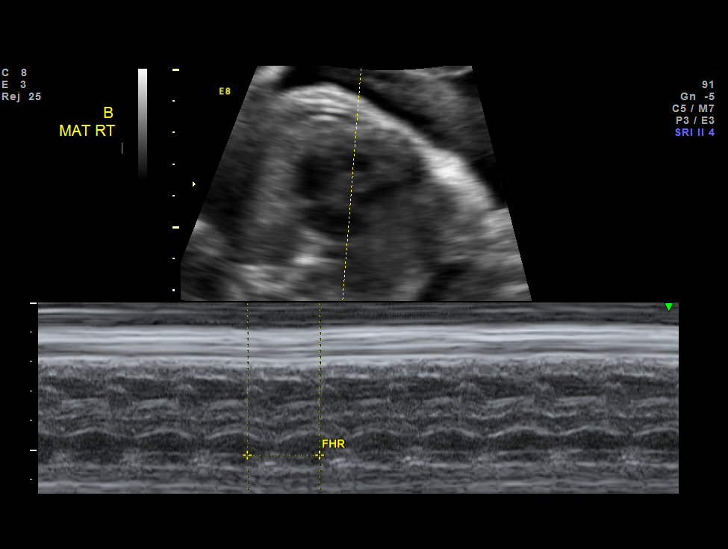
[im 17/21]
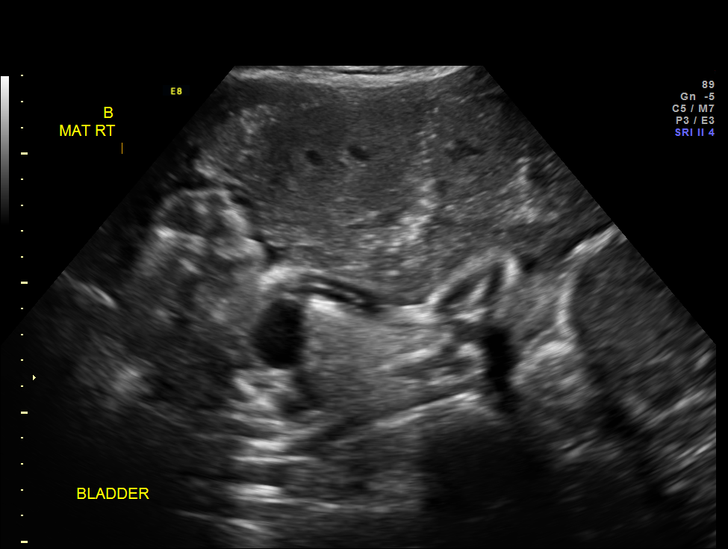
[im 19/21]
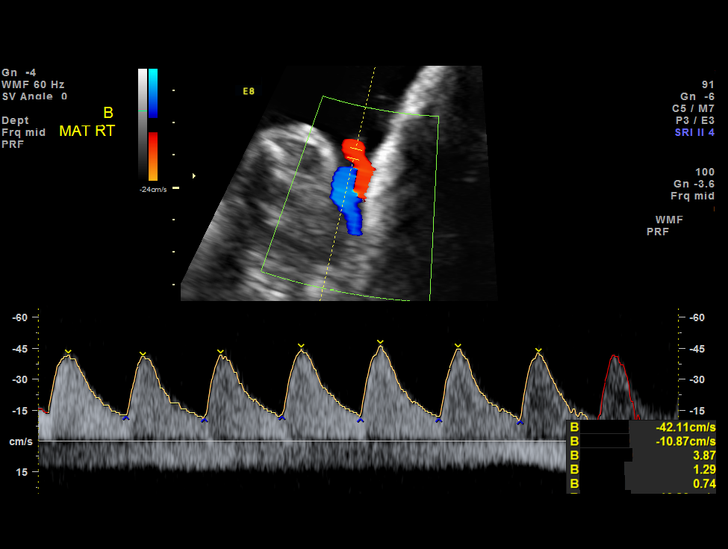
[im 21/21]
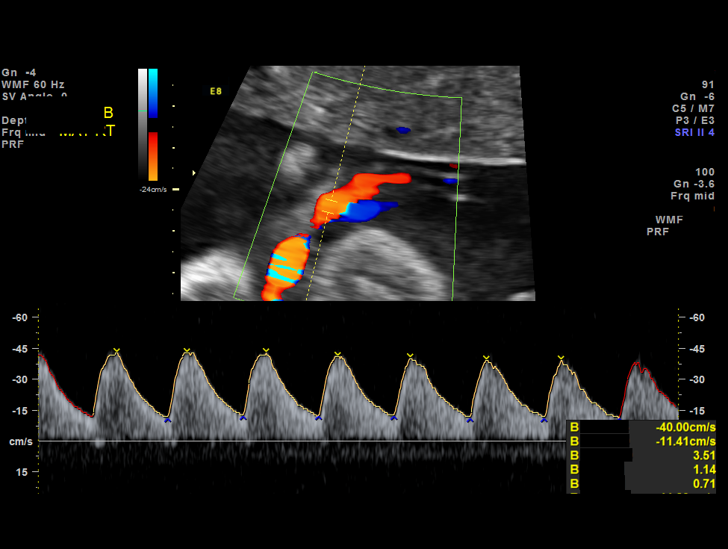

[12 of 21 positions shown; findings below may reference images not displayed]

OBSTETRICS REPORT
                      (Signed Final 10/22/2013 [DATE])

Service(s) Provided

 US UA CORD DOPPLER                                    76820.0
 US UA ADDL GEST                                       76820.1
Indications

 Twin gestation, Segui
 Poor obstetric history: Previous gestational HTN
 Twin A: (presenting twin) growth restriction (AC <
 3rd %tile)
Fetal Evaluation (Fetus A)

 Num Of Fetuses:    2
 Fetal Heart Rate:  125                          bpm
 Cardiac Activity:  Observed
 Fetal Lie:         Left Fetus
 Presentation:      Cephalic
 Placenta:          Anterior, above cervical os
 P. Cord            Previously Visualized
 Insertion:

 Membrane Desc:     Dividing
                    Membrane seen
                    - Monochorionic

 Amniotic Fluid
 AFI FV:      Subjectively within normal limits
                                             Larg Pckt:     3.3  cm
Biophysical Evaluation (Fetus A)

 Amniotic F.V:   Pocket => 2 cm two         F. Tone:        Observed
                 planes
 F. Movement:    Observed                   N.S.T:          Reactive
 F. Breathing:   Not Observed               Score:          [DATE]
Gestational Age (Fetus A)

 LMP:           33w 3d        Date:  03/02/13                 EDD:   12/07/13
 Best:          33w 3d     Det. By:  LMP  (03/02/13)          EDD:   12/07/13
Doppler - Fetal Vessels (Fetus A)
 Umbilical Artery
 S/D:   3.19           78  %tile       RI:
 PI:    1.07                           PSV:       62.1    cm/s
 Umbilical Artery
 Absent DFV:    No     Reverse DFV:    No

Fetal Evaluation (Fetus B)

 Num Of Fetuses:    2
 Fetal Heart Rate:  139                          bpm
 Cardiac Activity:  Observed
 Fetal Lie:         Right Fetus
 Presentation:      Breech
 Placenta:          Anterior, above cervical os
 P. Cord            Previously Visualized
 Insertion:

 Membrane Desc:     Dividing
                    Membrane seen
                    - Monochorionic

 Amniotic Fluid
 AFI FV:      Subjectively within normal limits
                                             Larg Pckt:     3.8  cm
Biophysical Evaluation (Fetus B)

 Amniotic F.V:   Within normal limits       F. Tone:        Observed
 F. Movement:    Observed                   N.S.T:          Reactive
 F. Breathing:   Observed                   Score:          [DATE]
Gestational Age (Fetus B)

 LMP:           33w 3d        Date:  03/02/13                 EDD:   12/07/13
 Best:          33w 3d     Det. By:  LMP  (03/02/13)          EDD:   12/07/13
Doppler - Fetal Vessels (Fetus B)

 Umbilical Artery
 S/D:   3.75           94  %tile       RI:
 PI:    1.22                           PSV:       50.78   cm/s
 Umbilical Artery
 Absent DFV:    No     Reverse DFV:    No

Impression

 Monochorionic/diamniotic twin pregnancy at 33+3 weeks
 Cephalic/breech presentation
 UA dopplers were normal for this GA x 2
 Twin A: [DATE] (-2 for no BM)
 Twin B: [DATE]
Recommendations

 Continue twice weekly BPPs and UA dopplers
 questions or concerns.

## 2013-10-23 ENCOUNTER — Other Ambulatory Visit (HOSPITAL_COMMUNITY): Payer: Medicaid Other

## 2013-10-26 ENCOUNTER — Ambulatory Visit (HOSPITAL_COMMUNITY): Payer: Medicaid Other

## 2013-10-26 ENCOUNTER — Other Ambulatory Visit (HOSPITAL_COMMUNITY): Payer: Medicaid Other

## 2013-10-28 ENCOUNTER — Ambulatory Visit (HOSPITAL_COMMUNITY): Payer: Medicaid Other

## 2013-10-29 ENCOUNTER — Ambulatory Visit (HOSPITAL_COMMUNITY): Payer: Medicaid Other

## 2013-10-30 ENCOUNTER — Other Ambulatory Visit (HOSPITAL_COMMUNITY): Payer: Medicaid Other

## 2013-10-30 ENCOUNTER — Other Ambulatory Visit (HOSPITAL_COMMUNITY): Payer: Self-pay | Admitting: Obstetrics and Gynecology

## 2013-10-30 ENCOUNTER — Ambulatory Visit (HOSPITAL_COMMUNITY)
Admission: RE | Admit: 2013-10-30 | Discharge: 2013-10-30 | Disposition: A | Discharge status: Home / Self Care | Payer: Medicaid Other | Source: Ambulatory Visit | Attending: Obstetrics and Gynecology | Admitting: Obstetrics and Gynecology

## 2013-10-30 DIAGNOSIS — O30009 Twin pregnancy, unspecified number of placenta and unspecified number of amniotic sacs, unspecified trimester: Secondary | ICD-10-CM

## 2013-10-30 DIAGNOSIS — O365990 Maternal care for other known or suspected poor fetal growth, unspecified trimester, not applicable or unspecified: Secondary | ICD-10-CM | POA: Insufficient documentation

## 2013-10-30 DIAGNOSIS — O09299 Supervision of pregnancy with other poor reproductive or obstetric history, unspecified trimester: Secondary | ICD-10-CM | POA: Insufficient documentation

## 2013-10-30 IMAGING — US US UA ADDL GEST RE-EVAL
1 series · 12 of 16 positions shown · non-contrast
Comparison: none

[Series 1: us ua addl gest re-eval · 0.23mm/px · 12 of 24 slices shown]
[im 1/24]
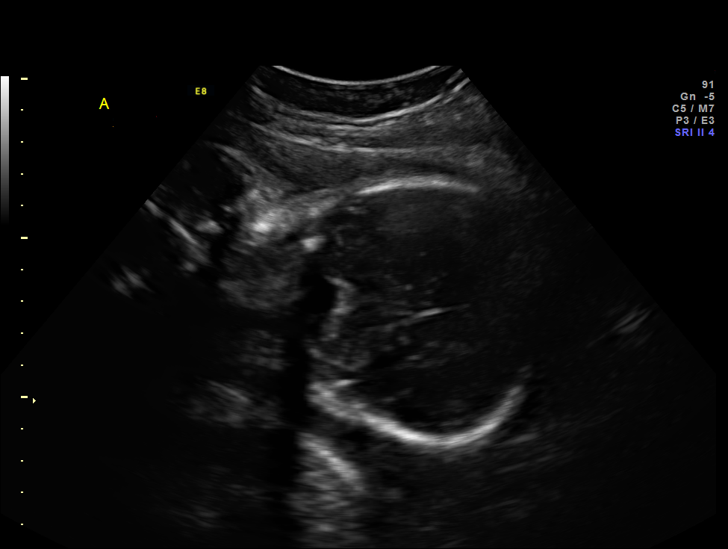
[im 4/24]
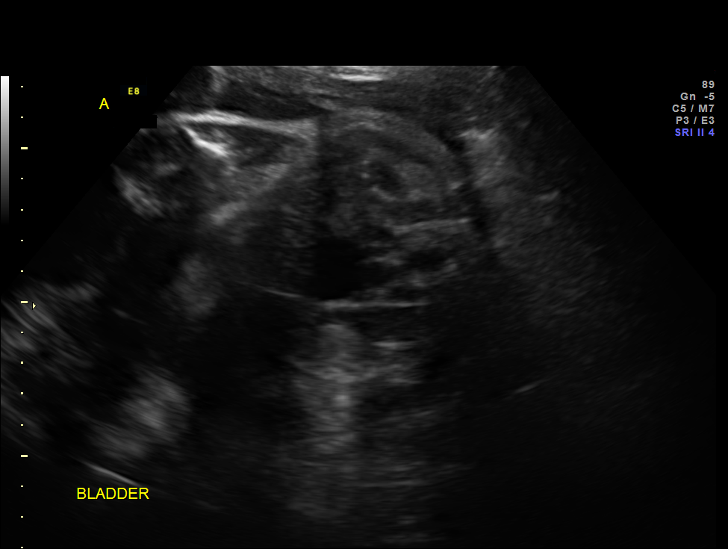
[im 5/24]
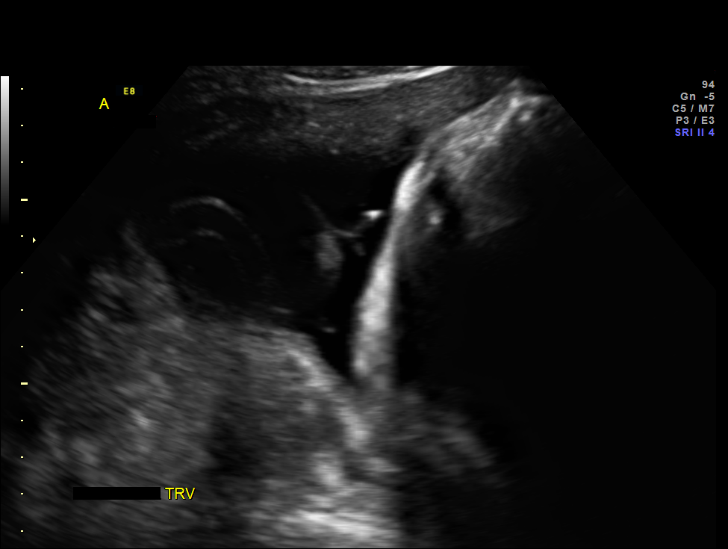
[im 7/24]
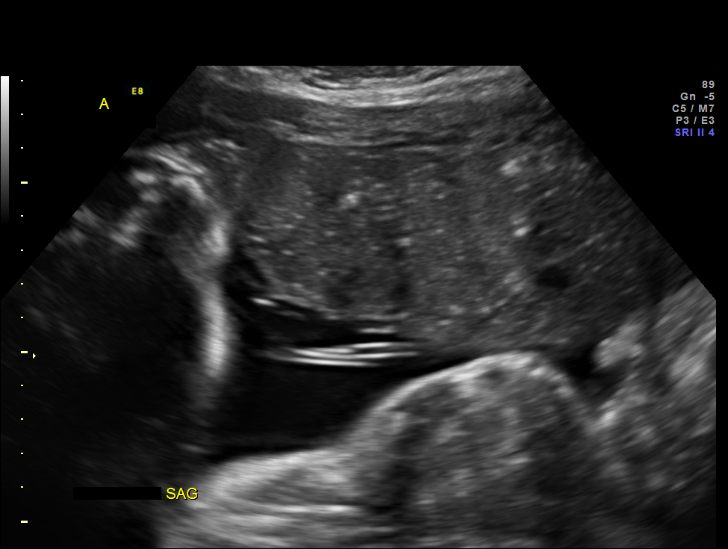
[im 10/24]
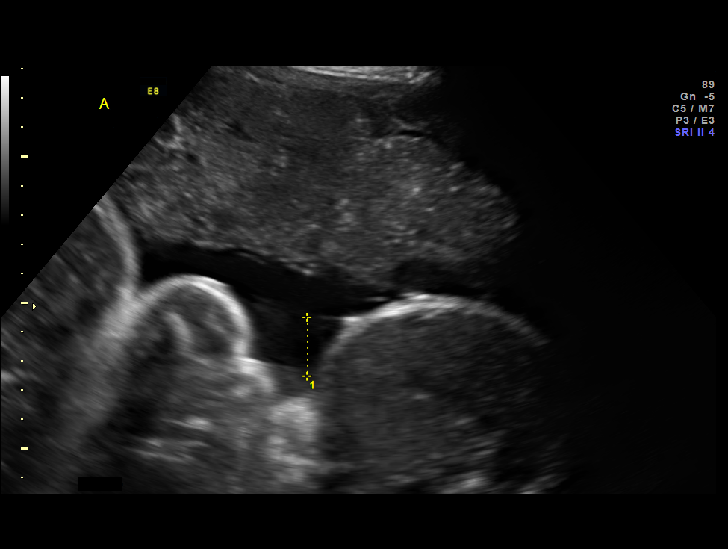
[im 11/24]
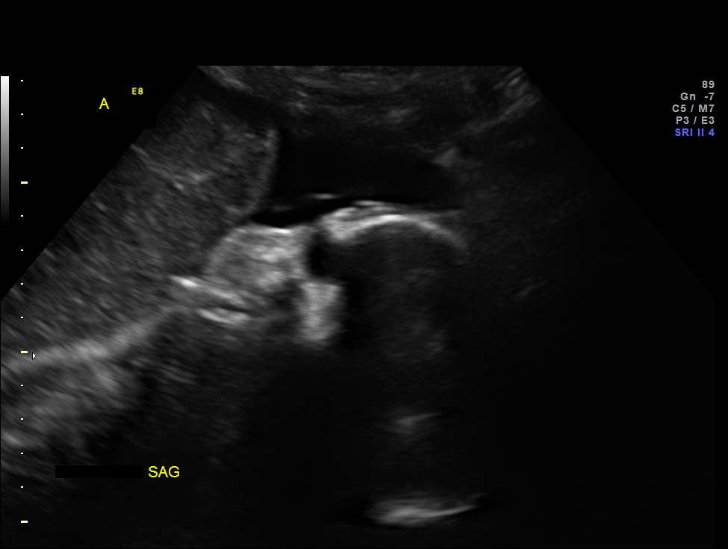
[im 13/24]
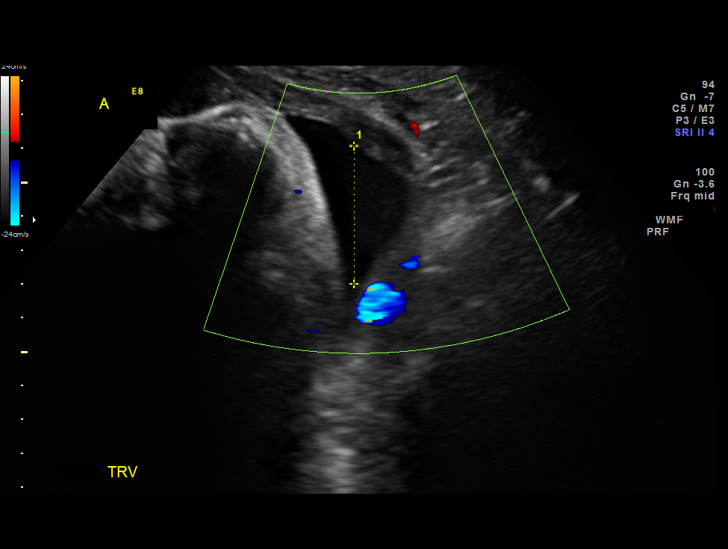
[im 16/24]
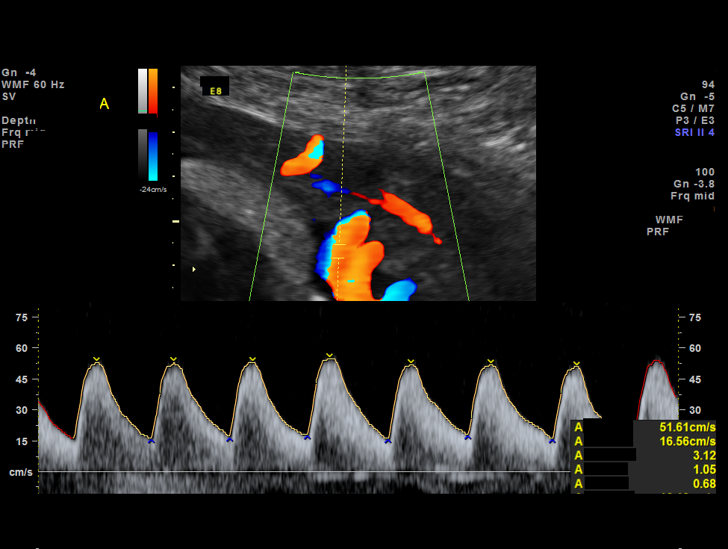
[im 17/24]
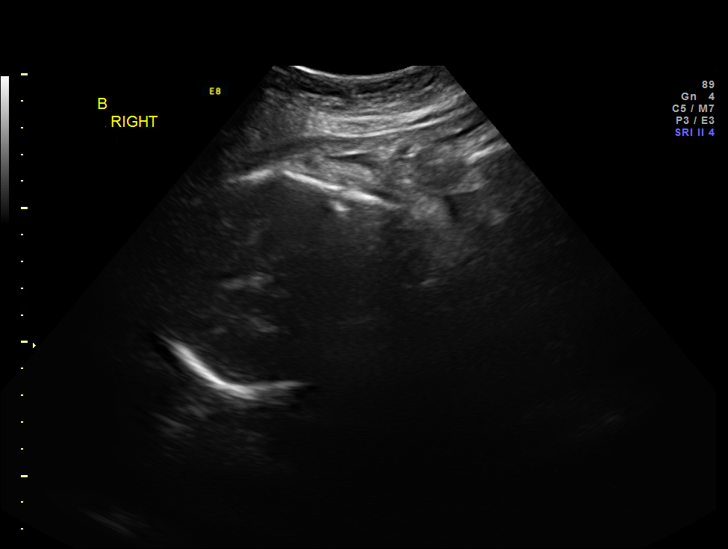
[im 19/24]
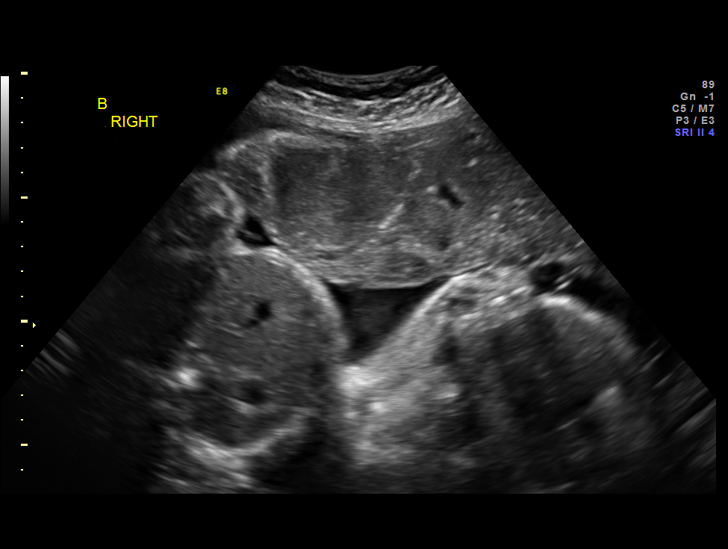
[im 22/24]
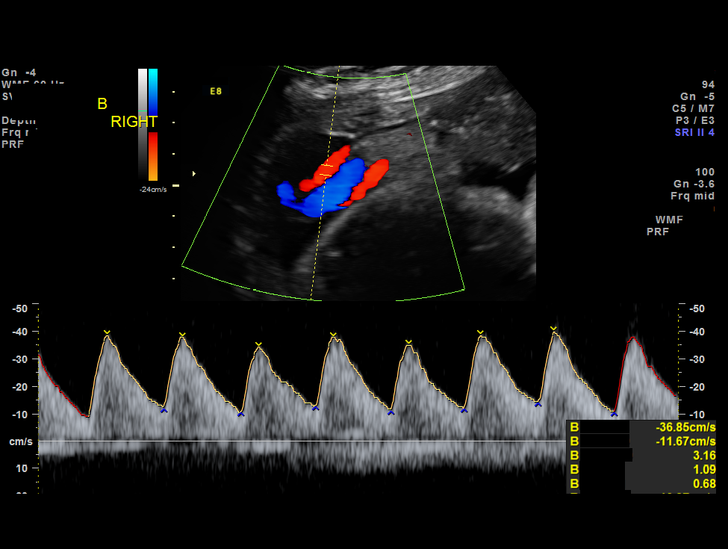
[im 24/24]
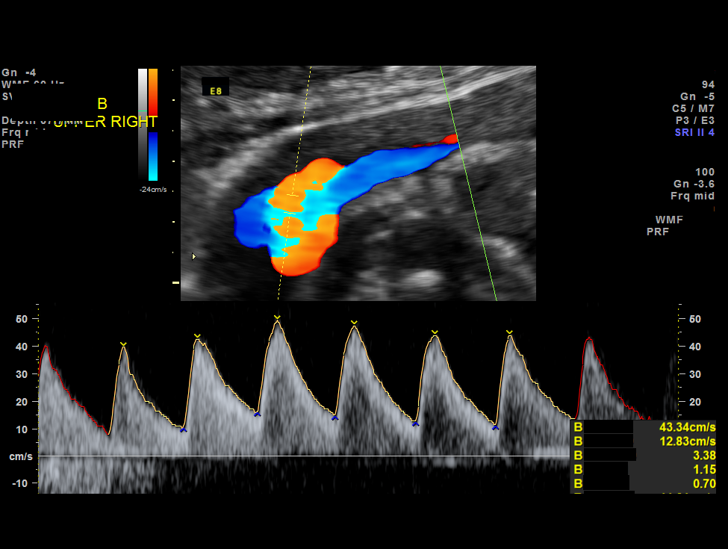

[12 of 16 positions shown; findings below may reference images not displayed]

OBSTETRICS REPORT
                      (Signed Final 10/30/2013 [DATE])

Service(s) Provided

 US UA CORD DOPPLER                                    76820.0
 US UA DOPPLER ADDL GEST RE EVAL                       76828.2
Indications

 Twin gestation, Mode
 Poor obstetric history: Previous gestational HTN
 Twin A: (presenting twin) growth restriction (AC <
 3rd %tile)
Fetal Evaluation (Fetus A)

 Num Of Fetuses:    2
 Fetal Heart Rate:  136                          bpm
 Cardiac Activity:  Observed
 Fetal Lie:         Left Fetus
 Presentation:      Cephalic
 Placenta:          Anterior, above cervical os
 P. Cord            Previously Visualized
 Insertion:

 Membrane Desc:     Dividing
                    Membrane seen
                    - Monochorionic

 Amniotic Fluid
 AFI FV:      Subjectively low-normal
                                             Larg Pckt:     4.0  cm
Biophysical Evaluation (Fetus A)

 Amniotic F.V:   Pocket => 2 cm two         F. Tone:        Observed
                 planes
 F. Movement:    Observed                   Score:          [DATE]
 F. Breathing:   Observed
Gestational Age (Fetus A)

 LMP:           34w 4d        Date:  03/02/13                 EDD:   12/07/13
 Best:          34w 4d     Det. By:  LMP  (03/02/13)          EDD:   12/07/13
Doppler - Fetal Vessels (Fetus A)
 Umbilical Artery
 S/D:   3.48           92  %tile       RI:
 PI:    1.15                           PSV:       82.43   cm/s
 Umbilical Artery
 Absent DFV:    No     Reverse DFV:    No

Fetal Evaluation (Fetus B)

 Num Of Fetuses:    2
 Fetal Heart Rate:  129                          bpm
 Cardiac Activity:  Observed
 Fetal Lie:         Right Fetus
 Presentation:      Breech
 Placenta:          Anterior, above cervical os
 P. Cord            Previously Visualized
 Insertion:

 Membrane Desc:     Dividing
                    Membrane seen
                    - Monochorionic

 Amniotic Fluid
 AFI FV:      Subjectively within normal limits
                                             Larg Pckt:     6.3  cm
Biophysical Evaluation (Fetus B)

 Amniotic F.V:   Pocket => 2 cm two         F. Tone:        Observed
                 planes
 F. Movement:    Observed                   Score:          [DATE]
 F. Breathing:   Observed
Gestational Age (Fetus B)

 LMP:           34w 4d        Date:  03/02/13                 EDD:   12/07/13
 Best:          34w 4d     Det. By:  LMP  (03/02/13)          EDD:   12/07/13
Doppler - Fetal Vessels (Fetus B)

 Umbilical Artery
 S/D:   3.31           89  %tile       RI:
 PI:    1.14                           PSV:       43.34   cm/s
 Umbilical Artery
 Absent DFV:    No     Reverse DFV:    No

Impression

 Monochorionic/diamniotic twin pregnancy at 34+4 weeks
 Twin A with asymmetric lagging growth
 Normal amniotic fluid volume x 2
 UA dopplers were normal for this GA x 2
 BPP [DATE] x 2
Recommendations

 Continue weekly testing
 Growth next week
 Would plan for a 36 week delivery based on type of twinning
 (Karyn/Konyell)

 questions or concerns.

## 2013-11-03 ENCOUNTER — Other Ambulatory Visit (HOSPITAL_COMMUNITY): Payer: Self-pay | Admitting: Obstetrics and Gynecology

## 2013-11-03 DIAGNOSIS — O30009 Twin pregnancy, unspecified number of placenta and unspecified number of amniotic sacs, unspecified trimester: Secondary | ICD-10-CM

## 2013-11-03 DIAGNOSIS — O365990 Maternal care for other known or suspected poor fetal growth, unspecified trimester, not applicable or unspecified: Secondary | ICD-10-CM

## 2013-11-03 DIAGNOSIS — O09299 Supervision of pregnancy with other poor reproductive or obstetric history, unspecified trimester: Secondary | ICD-10-CM

## 2013-11-04 ENCOUNTER — Ambulatory Visit (HOSPITAL_COMMUNITY): Payer: Medicaid Other

## 2013-11-05 ENCOUNTER — Ambulatory Visit (HOSPITAL_COMMUNITY)
Admission: RE | Admit: 2013-11-05 | Discharge: 2013-11-05 | Disposition: A | Discharge status: Home / Self Care | Payer: Medicaid Other | Source: Ambulatory Visit | Attending: Obstetrics and Gynecology | Admitting: Obstetrics and Gynecology

## 2013-11-05 DIAGNOSIS — O30009 Twin pregnancy, unspecified number of placenta and unspecified number of amniotic sacs, unspecified trimester: Secondary | ICD-10-CM | POA: Insufficient documentation

## 2013-11-05 DIAGNOSIS — O09299 Supervision of pregnancy with other poor reproductive or obstetric history, unspecified trimester: Secondary | ICD-10-CM | POA: Insufficient documentation

## 2013-11-05 DIAGNOSIS — O365990 Maternal care for other known or suspected poor fetal growth, unspecified trimester, not applicable or unspecified: Secondary | ICD-10-CM | POA: Insufficient documentation

## 2013-11-05 IMAGING — US US FETAL BPP W/O NONSTRESS
1 series · 12 of 19 positions shown · non-contrast
Comparison: none

[Series 1: us fetal bpp w/o nonstress · 0.28mm/px · 12 of 19 slices shown]
[im 1/19]
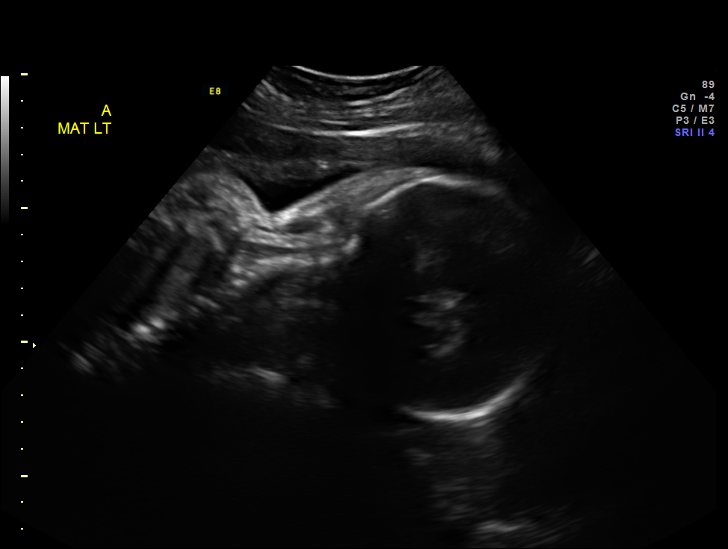
[im 3/19]
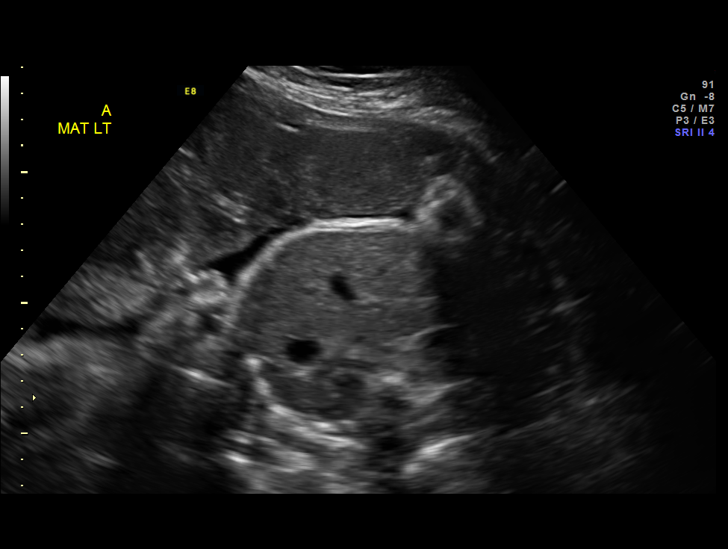
[im 4/19]
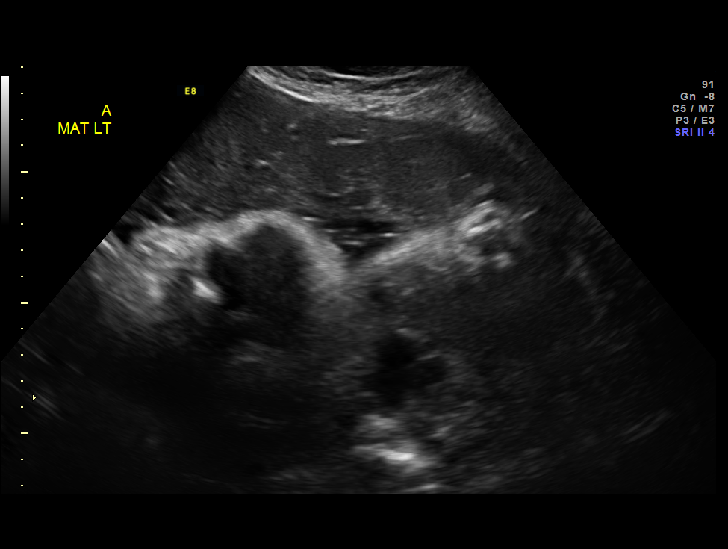
[im 6/19]
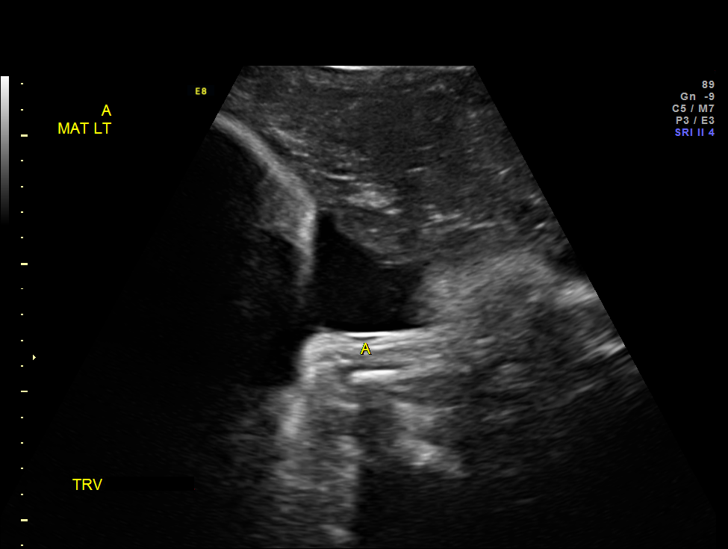
[im 8/19]
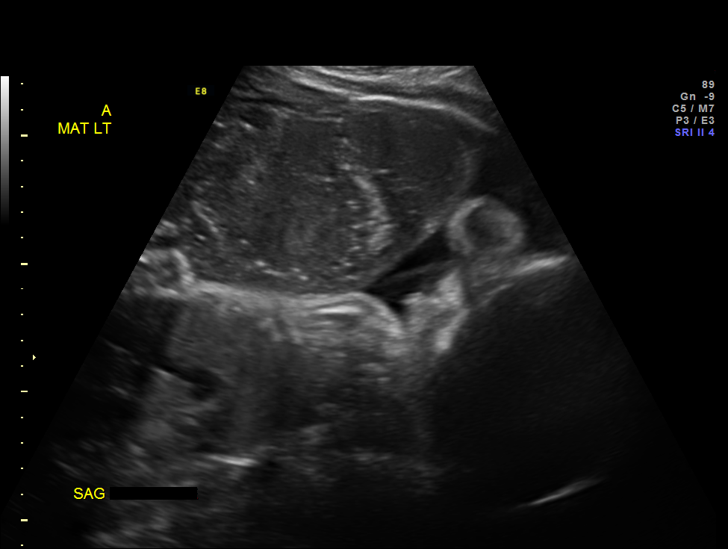
[im 9/19]
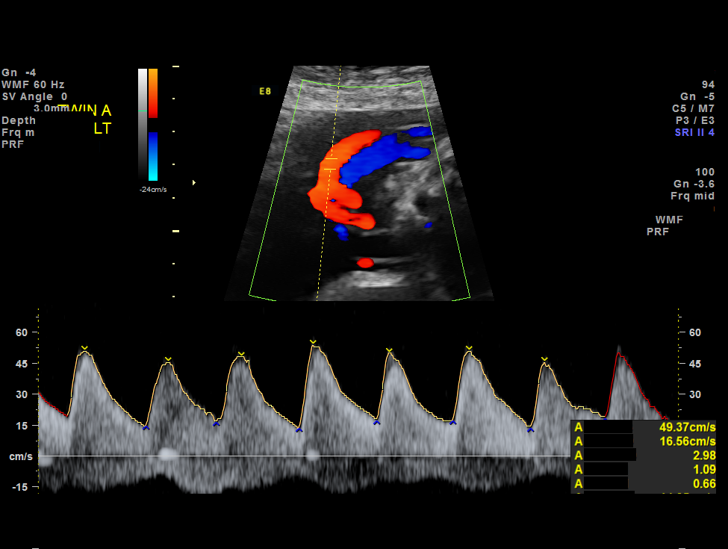
[im 11/19]
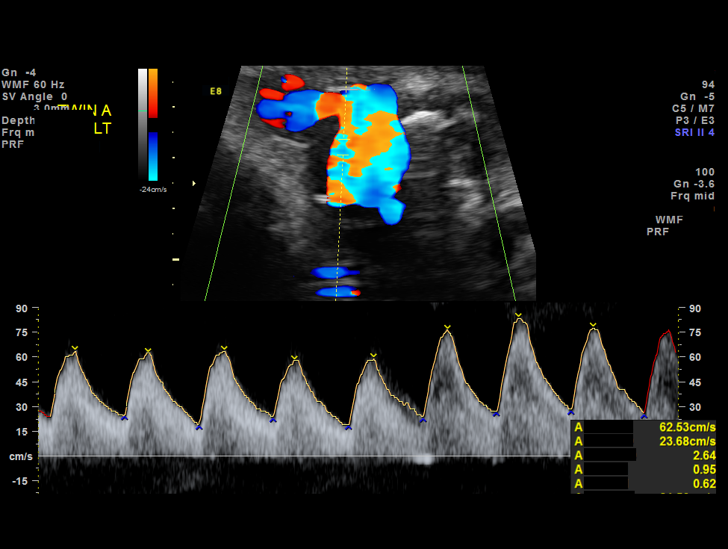
[im 12/19]
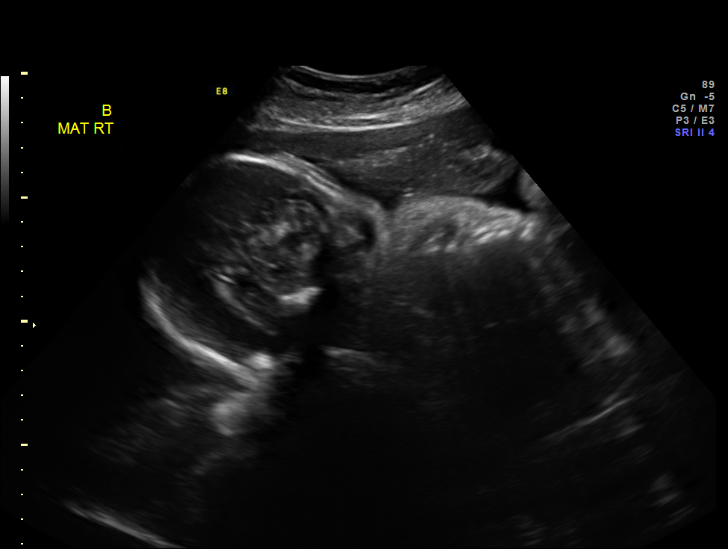
[im 14/19]
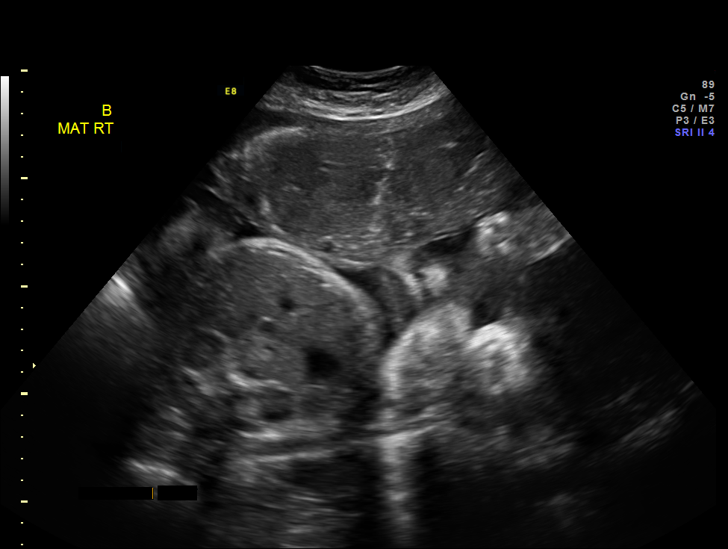
[im 16/19]
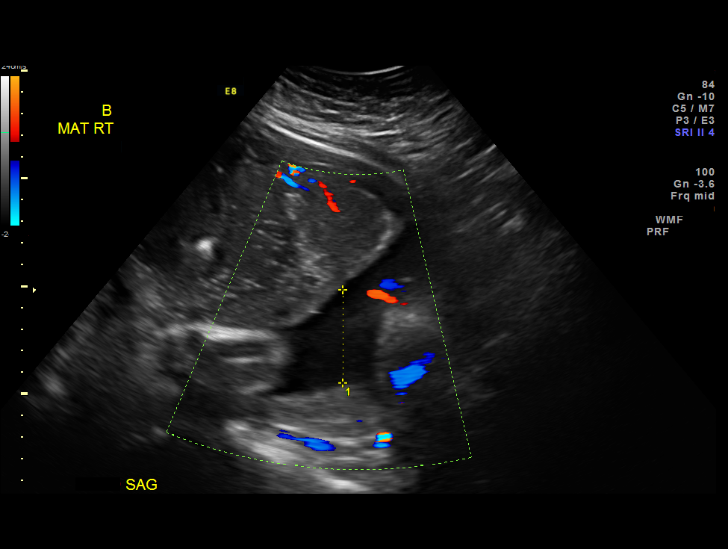
[im 17/19]
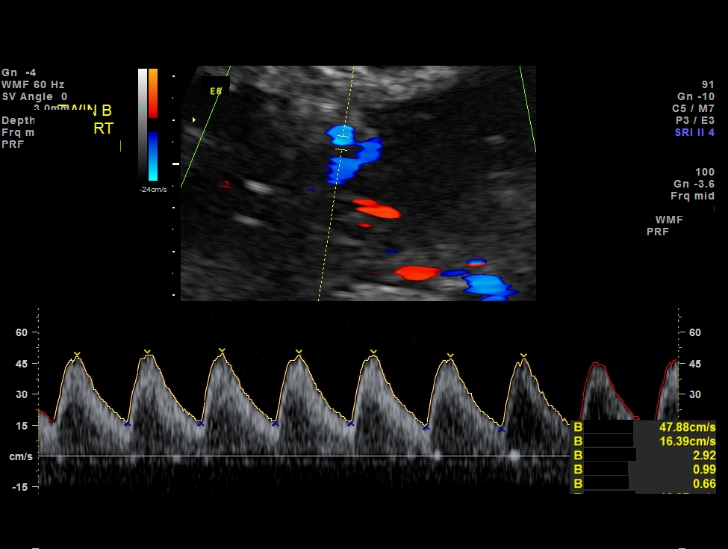
[im 19/19]
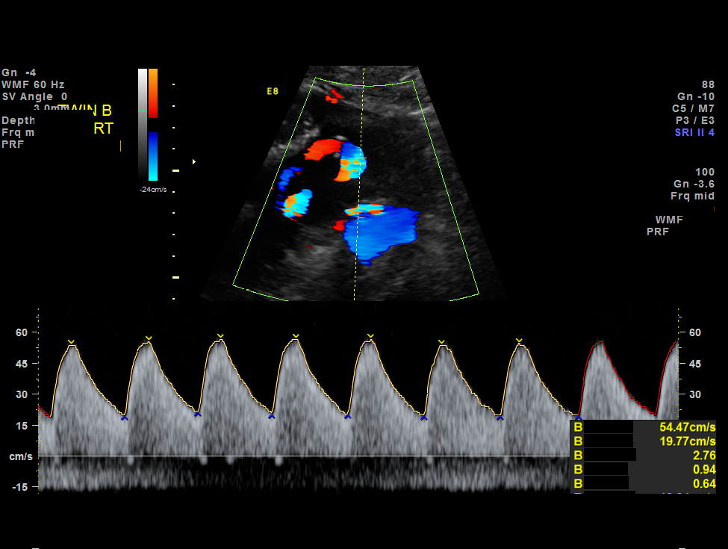

[12 of 19 positions shown; findings below may reference images not displayed]

OBSTETRICS REPORT
                      (Signed Final 11/05/2013 [DATE])

Service(s) Provided

 US UA CORD DOPPLER                                    76820.0
 US UA ADDL GEST                                       76820.1
Indications

 Twin gestation, Liu
 Poor obstetric history: Previous gestational HTN
 Twin A: (presenting twin) growth restriction (AC <
 3rd %tile)
Fetal Evaluation (Fetus A)

 Num Of Fetuses:    2
 Fetal Heart Rate:  136                          bpm
 Cardiac Activity:  Observed
 Fetal Lie:         Left Fetus
 Presentation:      Cephalic
 Placenta:          Anterior, above cervical os
 P. Cord            Previously Visualized
 Insertion:

 Membrane Desc:     Dividing
                    Membrane seen
                    - Monochorionic

 Amniotic Fluid
 AFI FV:      Subjectively low-normal
                                             Larg Pckt:     4.7  cm
Biophysical Evaluation (Fetus A)

 Amniotic F.V:   Pocket => 2 cm two         F. Tone:        Observed
                 planes
 F. Movement:    Observed                   Score:          [DATE]
 F. Breathing:   Observed
Gestational Age (Fetus A)

 LMP:           35w 3d        Date:  03/02/13                 EDD:   12/07/13
 Best:          35w 3d     Det. By:  LMP  (03/02/13)          EDD:   12/07/13
Doppler - Fetal Vessels (Fetus A)
 Umbilical Artery
 S/D:   2.87           71  %tile       RI:
 PI:    1.02                           PSV:       62.53   cm/s
 Umbilical Artery
 Absent DFV:    No     Reverse DFV:    No

Fetal Evaluation (Fetus B)

 Num Of Fetuses:    2
 Fetal Heart Rate:  147                          bpm
 Cardiac Activity:  Observed
 Fetal Lie:         Right Fetus
 Presentation:      Breech
 Placenta:          Anterior, above cervical os
 P. Cord            Previously Visualized
 Insertion:

 Membrane Desc:     Dividing
                    Membrane seen
                    - Monochorionic

 Amniotic Fluid
 AFI FV:      Subjectively within normal limits
                                             Larg Pckt:     4.3  cm
Biophysical Evaluation (Fetus B)

 Amniotic F.V:   Pocket => 2 cm two         F. Tone:        Observed
                 planes
 F. Movement:    Observed                   Score:          [DATE]
 F. Breathing:   Observed
Gestational Age (Fetus B)

 LMP:           35w 3d        Date:  03/02/13                 EDD:   12/07/13
 Best:          35w 3d     Det. By:  LMP  (03/02/13)          EDD:   12/07/13
Doppler - Fetal Vessels (Fetus B)

 Umbilical Artery
 S/D:   2.81           68  %tile       RI:
 PI:    0.96                           PSV:       54.47   cm/s
 Umbilical Artery
 Absent DFV:    No     Reverse DFV:    No

Impression

 Monochorionic/diamniotic twin pregnancy at 35+3 weeks
 Twin A with asymmetric lagging growth
 Normal amniotic fluid volume x 2
 UA dopplers were normal for this GA x 2
 BPP [DATE] x 2
Recommendations

 Patient scheduled for delivery early next week.
 Follow-up ultrasounds as clinically indicated.
 questions or concerns.

## 2014-03-16 ENCOUNTER — Encounter (HOSPITAL_COMMUNITY): Payer: Self-pay

## 2014-06-09 ENCOUNTER — Encounter (HOSPITAL_COMMUNITY): Payer: Self-pay | Admitting: *Deleted

## 2014-07-26 ENCOUNTER — Encounter (HOSPITAL_COMMUNITY): Payer: Self-pay | Admitting: *Deleted

## 2020-10-18 ENCOUNTER — Institutional Professional Consult (permissible substitution): Payer: Medicaid Other | Admitting: Neurology

## 2020-11-08 ENCOUNTER — Other Ambulatory Visit: Payer: Self-pay | Admitting: Neurology

## 2020-11-08 ENCOUNTER — Encounter: Payer: Self-pay | Admitting: Neurology

## 2020-11-09 ENCOUNTER — Ambulatory Visit: Payer: Medicaid Other | Admitting: Neurology

## 2020-11-09 ENCOUNTER — Encounter: Payer: Self-pay | Admitting: Neurology

## 2020-11-09 VITALS — BP 113/71 | HR 86 | Ht 66.5 in | Wt 247.0 lb

## 2020-11-09 DIAGNOSIS — R0683 Snoring: Secondary | ICD-10-CM

## 2020-11-09 DIAGNOSIS — E669 Obesity, unspecified: Secondary | ICD-10-CM | POA: Diagnosis not present

## 2020-11-09 DIAGNOSIS — G4726 Circadian rhythm sleep disorder, shift work type: Secondary | ICD-10-CM | POA: Diagnosis not present

## 2020-11-09 DIAGNOSIS — R0689 Other abnormalities of breathing: Secondary | ICD-10-CM

## 2020-11-09 DIAGNOSIS — R519 Headache, unspecified: Secondary | ICD-10-CM | POA: Diagnosis not present

## 2020-11-09 NOTE — Patient Instructions (Signed)

## 2020-11-09 NOTE — Progress Notes (Addendum)
SLEEP MEDICINE CLINIC    Provider:  Melvyn Novas, MD  Primary Care Physician:  Katesha, Eichel, FNP [address] [place] [address]     Referring Provider: Elisabeth Most, Fnp [address] [place],  [place] [address]          Chief Complaint according to patient   Patient presents with:    . New Patient (Initial Visit)     presents today to eval osa is a concern. Never had a SS. She states one evening she was sleeping on couch and could her daughter talking but she couldn't speak up- sleep paralysis . Since then she started snoring at night and has woke her self up snoring or gasping for air. Avg 6 hrs of sleep and states still tired when waking up.      HISTORY OF PRESENT ILLNESS:  Allison Russell is a 30 year old Caucasian female patient seen here upon a referral on 11/09/2020 from PCP for a sleep evaluation.  Chief concern according to patient :  see above    I have the pleasure of seeing Allison Russell today, right -handed Caucasian female with a possible sleep disorder.  She  has a past medical history of Anxiety and depression and Headache..     Sleep relevant medical history: NON refreshing sleep, sleep paralysis, no Tonsillectomy,  Obesity.    Family medical /sleep history: father had OSA, passed at age 55 of a Myocardial infarction.she was 21 at the time .    Social history:  Patient is working as a Investment banker, corporate, shift working ,   and lives with spouse and 3 young chidren . 3  Dogs.  Tobacco use- active, 1/2 ppd.Marland Kitchen  ETOH use ; none ,  Caffeine intake in form of  Soda( 4 a day)  Regular exercise in form of n/a        Sleep habits are as follows: The patient's dinner time is between 8-11 PM.  The patient goes to bed at 11 PM and has to leave at 4.30. AM . The preferred sleep position is right side , with the support of 3 pillows. She snores, she gasps.   Dreams are reportedly frequent/vivid.  4.30  AM is the usual rise time for early shift, and  late shift days start at 1 PM.  The patient wakes up with an alarm.  She reports not feeling refreshed or restored in AM, with symptoms such as dry mouth , morning and chronic headaches, and residual fatigue.  Naps are taken infrequently, lasting from 2 hours- to 6 hours.   Review of Systems: Out of a complete 14 system review, the patient complains of only the following symptoms, and all other reviewed systems are negative.:  Fatigue, sleepiness , snoring, fragmented sleep, shift work.     How likely are you to doze in the following situations: 0 = not likely, 1 = slight chance, 2 = moderate chance, 3 = high chance   Sitting and Reading? Watching Television? Sitting inactive in a public place (theater or meeting)? As a passenger in a car for an hour without a break? Lying down in the afternoon when circumstances permit? Sitting and talking to someone? Sitting quietly after lunch without alcohol? In a car, while stopped for a few minutes in traffic?   Total = 6/ 24 points   FSS endorsed at 36/ 63 points.   Social History   Socioeconomic History  .  Marital status: Single    Spouse name: Not on file  . Number of children: Not on file  . Years of education: Not on file  . Highest education level: Not on file  Occupational History  . Not on file  Tobacco Use  . Smoking status: Current Every Day Smoker    Packs/day: 0.50    Types: Cigarettes  . Smokeless tobacco: Never Used  Substance and Sexual Activity  . Alcohol use: Not Currently  . Drug use: Never  . Sexual activity: Not on file  Other Topics Concern  . Not on file  Social History Narrative  . Not on file   Social Determinants of Health   Financial Resource Strain: Not on file  Food Insecurity: Not on file  Transportation Needs: Not on file  Physical Activity: Not on file  Stress: Not on file  Social Connections: Not on file    Family History  Problem Relation Age of Onset  . Hypertension Mother   .  Diabetes Father   . Heart attack Father   . Sleep apnea Father   . Breast cancer Maternal Grandmother   . Bone cancer Maternal Grandmother   . Cancer Paternal Grandmother   . Cancer Paternal Grandfather     Past Medical History:  Diagnosis Date  . Anxiety and depression   . Headache     Past Surgical History:  Procedure Laterality Date  . CESAREAN SECTION       No current outpatient medications on file prior to visit.   No current facility-administered medications on file prior to visit.    No Known Allergies  Physical exam:  Today's Vitals   11/09/20 1007  BP: 113/71  Pulse: 86  Weight: 247 lb (112 kg)  Height: 5' 6.5" (1.689 m)   Body mass index is 39.27 kg/m.   Wt Readings from Last 3 Encounters:  11/09/20 247 lb (112 kg)  11/05/13 224 lb (101.6 kg)  10/30/13 220 lb (99.8 kg)     Ht Readings from Last 3 Encounters:  11/09/20 5' 6.5" (1.689 m)      General: The patient is awake, alert and appears not in acute distress. The patient is groomed and cooperative . Head: Normocephalic, atraumatic.  Neck is supple. Mallampati 21,  neck circumference: 15.5  inches . Nasal airflow  patent.   Retrognathia is  seen.  Dental status:  Cardiovascular:  Regular rate and cardiac rhythm by pulse,  without distended neck veins. Respiratory: Lungs are clear to auscultation.  Skin:  Without evidence of ankle edema, or rash. Trunk: The patient's posture is erect.   Neurologic exam : The patient is awake and alert, oriented to place and time.   Memory subjective described as intact.  Attention span & concentration ability appears normal.  Speech is fluent,  without  dysarthria, dysphonia or aphasia.  Mood and affect are appropriate.   Cranial nerves: no loss of smell or taste reported  Pupils are equal and briskly reactive to light. Funduscopic exam deferred.  Extraocular movements in vertical and horizontal planes were intact and without nystagmus.  No  Diplopia. Visual fields by finger perimetry are intact. Hearing was intact to soft voice and finger rubbing.    Facial sensation intact to fine touch.  Facial motor strength is symmetric and tongue and uvula move midline.  Neck ROM : rotation, tilt and flexion extension were normal for age and shoulder shrug was symmetrical.    Motor exam:  Symmetric bulk, tone and  ROM.   Normal tone without cog-wheeling, symmetric grip strength .   Sensory:  Fine touch, pinprick and vibration were  normal.  Proprioception tested in the upper extremities was normal.   Coordination: Rapid alternating movements in the fingers/hands were of normal speed.  The Finger-to-nose maneuver was intact without evidence of ataxia, dysmetria or tremor.   Gait and station: Patient could rise unassisted from a seated position, walked without assistive device.  Stance is of normal width/ base .Marland Kitchen  Toe and heel walk were intact. Turning with 3 steps.   Deep tendon reflexes: in the  upper and lower extremities are symmetric and intact.  Babinski response was deferred.      After spending a total time of  45 minutes face to face and additional time for physical and neurologic examination, review of laboratory studies,  personal review of imaging studies, reports and results of other testing and review of referral information / records as far as provided in visit, I have established the following assessments:  1) snoring and excessive sleepiness were reported  To PCP, here she was reluctant to endorse a higher Epworth score. EDS may rleated to OSA. 2)Shift work sleep deprivation, and sleep paralysis 3) OBESITY - large neck , are risk factors. on phentermine.  3) migrainous headache - nausea, lights, photophobia. And daily headaches of non migrainous character. Admits to non compliance with medication.TOPIRAMATE.   My Plan is to proceed with:  1) screening for sleep apnea with either HST or PSG.  2) weight loss, she needs  guidance and exercise.  Weight and wellness referral.  3) try to reduce the shift work changes, try working one week one shift.   I would like to thank  Elisabeth Most, Fnp [address] [place],  [place] [address] for allowing me to meet with and to take care of this pleasant patient.   In short, Allison Russell is tofollow up either personally or through our NP within 3 month.   CC: I will share my notes with PCP. Patient has not been vaccinated against COVID 19.  Electronically signed by: Melvyn Novas, MD 11/09/2020 10:33 AM  [facility] Board certified by The [employer] of Sleep Medicine and Diplomate of the [employer] of Sleep Medicine. Board certified In Neurology through the ABPN, Fellow of the [employer] of Neurology. Medical Director of [facility].

## 2020-12-26 ENCOUNTER — Telehealth: Payer: Self-pay | Admitting: Neurology

## 2020-12-26 NOTE — Telephone Encounter (Signed)
@   3:17pm pt left a vm asking to be called re: the scheduling of her sleep consult

## 2020-12-27 ENCOUNTER — Telehealth: Payer: Self-pay

## 2020-12-27 NOTE — Telephone Encounter (Signed)
LVM for pt to call me back to schedule sleep study  

## 2021-01-16 ENCOUNTER — Ambulatory Visit (INDEPENDENT_AMBULATORY_CARE_PROVIDER_SITE_OTHER): Payer: Medicaid Other | Admitting: Neurology

## 2021-01-16 DIAGNOSIS — G4733 Obstructive sleep apnea (adult) (pediatric): Secondary | ICD-10-CM

## 2021-01-16 DIAGNOSIS — R0683 Snoring: Secondary | ICD-10-CM

## 2021-01-16 DIAGNOSIS — E669 Obesity, unspecified: Secondary | ICD-10-CM

## 2021-01-16 DIAGNOSIS — R519 Headache, unspecified: Secondary | ICD-10-CM

## 2021-01-16 DIAGNOSIS — R0689 Other abnormalities of breathing: Secondary | ICD-10-CM

## 2021-01-16 DIAGNOSIS — G4726 Circadian rhythm sleep disorder, shift work type: Secondary | ICD-10-CM

## 2021-01-24 NOTE — Progress Notes (Signed)
[facility]  HOME SLEEP TEST (Watch PAT)  STUDY DATA downloaded: 01/24/21  DOB: 1991/01/13  MRN: [id number]  ORDERING CLINICIAN: Melvyn Novas, MD   REFERRING CLINICIAN: Elisabeth Most, FNP   CLINICAL INFORMATION/HISTORY: Allison Russell was seen 11-09-2020, she is right -handed Caucasian female with a medical history of Anxiety, Depression and Headache. The patient works third shift. She has gained weight,  is concerned about falling asleep on her couch, hearing her child in the room, but being unable to respond to her.  She reported that she snores.  She wakes up not refreshed , nor restored.   Epworth sleepiness score: 6/24. FSS at 36/63 points.   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 Mild to moderate snoring was recorded. There were also some brief oxygen desaturations noted that were not sustained. No central apnea was captured.     RECOMMENDATION: This mild degree of apnea would need to be treated by CPAP, due to the REM sleep dependent form.  Long term goal should be a reduction in BMI to return to more restorative sleep breathing.  Third shift work is often associated with shorter daytime sleep and less deep sleep.  Sleep related and Migraine headaches can benefit fromTopiramate, from beta blocker use and sometimes from use of a sleep aid.     INTERPRETING PHYSICIAN:  Melvyn Novas, MD     [facility] Board certified by The [employer] of Sleep Medicine and Diplomate of the [employer] of Sleep Medicine. Board certified In Neurology through the ABPN, Fellow of the [employer] of Neurology. Medical Director of [facility].   Sleep Summary  Oxygen Saturation Statistics   Start Study Time: End Study Time: Total Recording Time:  9:47:41 PM 5:38:38 AM 7 h, 50 min  Total Sleep Time % REM of Sleep Time:  6 h, 19 min  22.3    Mean: 95 Minimum: 61 Maximum: 99  Mean of Desaturations Nadirs (%):   87  Oxygen Desatur. %:  4-9 10-20 >20 Total  Events Number Total   29  17 61.7 36.2  1 2.1  47 100.0  Oxygen Saturation: <90 <=88 <85 <80 <70  Duration (minutes): Sleep % 7.6 2.0 6.0 2.9 1.6 0.8 1.1 0.3 0.2 0.1     Respiratory Indices      Total Events REM NREM All Night  pRDI: pAHI 3%: ODI 4%: pAHIc 3%: % CSR: pAHI 4%:  78  65  47 N/A 48 40.8 37.1 28.0 N/A 5.2 3.5 2.2 N/A 13.1 10.9 7.9 N/A 8.1       Pulse Rate Statistics during Sleep (BPM)      Mean: 73 Minimum: 52 Maximum: 108  Body Position Statistics  Position Supine Prone Right Left Non-Supine  Sleep (min) 0.0 0.0 18.0 0.0 18.0  Sleep % 0.0 0.0 4.7 0.0 4.7  pRDI N/A N/A 0.0 N/A 0.0  pAHI 3% N/A N/A 0.0 N/A 0.0  ODI 4% N/A N/A 0.0 N/A 0.0     Snoring Statistics Snoring Level (dB) >40 >50 >60 >70 >80 >Threshold (45)  Sleep (min) 17.9 13.2 0.1 0.0 0.0 17.9  Sleep % 4.7 3.5 0.0 0.0 0.0 4.7    Mean: 41 dB

## 2021-02-13 NOTE — Procedures (Signed)
[facility]  HOME SLEEP TEST (Watch PAT)  STUDY DATA downloaded: 01/24/21  DOB: Jul 03, 1991  MRN: [id number]  ORDERING CLINICIAN: Melvyn Novas, MD   REFERRING CLINICIAN: Elisabeth Most, FNP   CLINICAL INFORMATION/HISTORY: Allison Russell was seen 11-09-2020, she is right -handed Caucasian female with a medical history of Anxiety, Depression and Headache. The patient works third shift. She has gained weight,  is concerned about falling asleep on her couch, hearing her child in the room, but being unable to respond to her.  She reported that she snores.  She wakes up not refreshed , nor restored.   Epworth sleepiness score: 6/24. FSS at 36/63 points.   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 Mild to moderate snoring was recorded. There were also some brief oxygen desaturations noted that were not sustained. No central apnea was captured.     RECOMMENDATION: This mild degree of apnea would need to be treated by CPAP, due to the REM sleep dependent form. I recommend auto CPAP 6-16 cm water, 3 cm EPR and a nasal mask to get easier used to the device.  Long term goal should be a reduction in BMI to return to more restorative sleep breathing.  Third shift work is often associated with shorter daytime sleep and less deep sleep.  Sleep related and Migraine headaches can benefit fromTopiramate, from beta blocker use and  sometimes from use of a sleep aid.     INTERPRETING PHYSICIAN:  Melvyn Novas, MD    [facility] and [facility] Sleep Board certified by The [employer] of Sleep Medicine and Diplomate of the [employer] of Sleep Medicine. Board certified In Neurology through the ABPN, Fellow of the [employer] of Neurology. Medical Director of [employer].   Sleep Summary  Oxygen Saturation Statistics   Start Study Time: End Study Time: Total Recording Time:  9:47:41 PM 5:38:38 AM 7 h, 50 min  Total Sleep Time % REM of Sleep Time:  6 h, 19 min  22.3    Mean: 95 Minimum: 61 Maximum: 99  Mean of Desaturations Nadirs (%):   87  Oxygen Desatur. %:  4-9 10-20 >20 Total  Events Number Total   29  17 61.7 36.2  1 2.1  47 100.0  Oxygen Saturation: <90 <=88 <85 <80 <70  Duration (minutes): Sleep % 7.6 2.0 6.0 2.9 1.6 0.8 1.1 0.3 0.2 0.1     Respiratory Indices      Total Events REM NREM All Night  pRDI: pAHI 3%: ODI 4%: pAHIc 3%: % CSR: pAHI 4%:  78  65  47 N/A 48 40.8 37.1 28.0 N/A 5.2 3.5 2.2 N/A 13.1 10.9 7.9 N/A 8.1       Pulse Rate Statistics during  Sleep (BPM)      Mean: 73 Minimum: 52 Maximum: 108        Body Position Statistics  Position Supine Prone Right Left Non-Supine  Sleep (min) 0.0 0.0 18.0 0.0 18.0  Sleep % 0.0 0.0 4.7 0.0 4.7  pRDI N/A N/A 0.0 N/A 0.0  pAHI 3% N/A N/A 0.0 N/A 0.0  ODI 4% N/A N/A 0.0 N/A 0.0     Snoring Statistics Snoring Level (dB) >40 >50 >60 >70 >80 >Threshold (45)  Sleep (min) 17.9 13.2 0.1 0.0 0.0 17.9  Sleep % 4.7 3.5 0.0 0.0 0.0 4.7

## 2021-02-13 NOTE — Addendum Note (Signed)
Addended by: Melvyn Novas on: 02/13/2021 09:05 AM   Modules accepted: Orders

## 2021-02-13 NOTE — Progress Notes (Signed)
pAHI (per hour): 10.9/h IMPRESSION: This HST documented overall mild OSA (obstructive sleep apnea) with a strong REM dependent AHI of 37.1/h. Mild to moderate snoring was recorded. There were also some brief oxygen desaturations noted that were not sustained. No central apnea was captured.     RECOMMENDATION: This mild degree of apnea would need to be treated by CPAP, due to the REM sleep dependent form. I recommend auto CPAP 6-16 cm water, 3 cm EPR and a nasal mask to get easier used to the device.  Long term goal should be a reduction in BMI to return to more restorative sleep breathing.  Third shift work is often associated with shorter daytime sleep and less deep sleep.  Sleep related and Migraine headaches can benefit fromTopiramate, from beta blocker use and sometimes from use of a sleep aid.

## 2021-02-14 ENCOUNTER — Telehealth: Payer: Self-pay | Admitting: Neurology

## 2021-02-14 NOTE — Telephone Encounter (Signed)
Called patient to discuss sleep study results. No answer at this time. LVM for the patient to call back.   

## 2021-03-07 NOTE — Telephone Encounter (Signed)
Called the pt for a 2nd time. There was no answer. LVM asking for a call back to review SSR.

## 2021-03-21 ENCOUNTER — Encounter: Payer: Self-pay | Admitting: Neurology

## 2021-04-11 NOTE — Telephone Encounter (Signed)
Pt called into the sleep lab stating that someone had already reviewed the SSR with her and she is waiting to hear from DME company.  I have forwarded the order to [facility]) for the pt and she will be advised to call and schedule a 31-90 day f/u

## 2021-06-26 NOTE — Patient Instructions (Addendum)

## 2021-06-26 NOTE — Progress Notes (Signed)
PATIENT: Allison Russell DOB: [birth year]  REASON FOR VISIT: follow up HISTORY FROM: patient  Chief Complaint  Patient presents with   Follow-up    RM 1, alone. Last seen 11/09/20. Initial cpap f/u     HISTORY OF PRESENT ILLNESS:  06/27/21 ALL:  Allison Russell is a 30 y.o. female here today for follow up for OSA on CPAP. HST 01/16/2021 showed OSA with total AHI 10.9/hr, REM AHI 37.1 and O2 nadir of 78%. AutoPAP ordered with pressure of 6-16cmH20 and nasal mask. She reports doing failry well on therapy. She feels that she is getting more comfortable with therapy. She does occasionally pull the mask off at night while sleeping. She feels this is getting better with time. No specific concerns with machine or supplies. She is resting better. She reports headaches are less severe and less frequent.   Compliance report dated 05/28/2021-06/26/2021 shows that she used CPAP 28/30 days for compliance of 93%. She used CPAP greater than 4 hours 25/30 days for compliance of 83%. Average usage was 6.8 hours. Residual AHI 0.6/hr on 6-16cmH20. No leak noted.   HISTORY: (copied from Dr Dohmeier's previous note)  Allison Russell is a 30 year old Caucasian female patient seen here upon a referral on 11/09/2020 from PCP for a sleep evaluation.  Chief concern according to patient :  see above    I have the pleasure of seeing Allison Russell today, right -handed Caucasian female with a possible sleep disorder.  She  has a past medical history of Anxiety and depression and Headache..   Sleep relevant medical history: NON refreshing sleep, sleep paralysis, no Tonsillectomy,  Obesity.   Family medical /sleep history: father had OSA, passed at age 63 of a Myocardial infarction.she was 21 at the time .    Social history:  Patient is working as a Investment banker, corporate, shift working ,   and lives with spouse and 3 young chidren . 3  Dogs.  Tobacco use- active, 1/2 ppd.Marland Kitchen  ETOH use ; none ,  Caffeine intake in form of   Soda( 4 a day)  Regular exercise in form of n/a    Sleep habits are as follows: The patient's dinner time is between 8-11 PM.  The patient goes to bed at 11 PM and has to leave at 4.30. AM . The preferred sleep position is right side , with the support of 3 pillows. She snores, she gasps.   Dreams are reportedly frequent/vivid.  4.30  AM is the usual rise time for early shift, and late shift days start at 1 PM.  The patient wakes up with an alarm.  She reports not feeling refreshed or restored in AM, with symptoms such as dry mouth , morning and chronic headaches, and residual fatigue.  Naps are taken infrequently, lasting from 2 hours- to 6 hours.   REVIEW OF SYSTEMS: Out of a complete 14 system review of symptoms, the patient complains only of the following symptoms, headaches and all other reviewed systems are negative.  ESS: previously 6/24, now 4/24 FSS: 15/63  ALLERGIES: No Known Allergies  HOME MEDICATIONS: No outpatient medications prior to visit.   No facility-administered medications prior to visit.    PAST MEDICAL HISTORY: Past Medical History:  Diagnosis Date   Anxiety and depression    Headache     PAST SURGICAL HISTORY: Past Surgical History:  Procedure Laterality Date   CESAREAN SECTION      FAMILY HISTORY: Family History  Problem Relation Age  of Onset   Hypertension Mother    Diabetes Father    Heart attack Father    Sleep apnea Father    Breast cancer Maternal Grandmother    Bone cancer Maternal Grandmother    Cancer Paternal Grandmother    Cancer Paternal Grandfather     SOCIAL HISTORY: Social History   Socioeconomic History   Marital status: Single    Spouse name: Not on file   Number of children: Not on file   Years of education: Not on file   Highest education level: Not on file  Occupational History   Not on file  Tobacco Use   Smoking status: Every Day    Packs/day: 0.50    Types: Cigarettes   Smokeless tobacco: Never   Substance and Sexual Activity   Alcohol use: Not Currently   Drug use: Never   Sexual activity: Not on file  Other Topics Concern   Not on file  Social History Narrative   Not on file   Social Determinants of Health   Financial Resource Strain: Not on file  Food Insecurity: Not on file  Transportation Needs: Not on file  Physical Activity: Not on file  Stress: Not on file  Social Connections: Not on file  Intimate Partner Violence: Not on file     PHYSICAL EXAM  Vitals:   06/27/21 0752  BP: 140/84  Pulse: 79  SpO2: 98%  Weight: 251 lb 8 oz (114.1 kg)  Height: 5' 6.5" (1.689 m)   Body mass index is 39.99 kg/m.  Generalized: Well developed, in no acute distress  Cardiology: normal rate and rhythm, no murmur noted Respiratory: clear to auscultation bilaterally  Neurological examination  Mentation: Alert oriented to time, place, history taking. Follows all commands speech and language fluent Cranial nerve II-XII: Pupils were equal round reactive to light. Extraocular movements were full, visual field were full  Motor: The motor testing reveals 5 over 5 strength of all 4 extremities. Good symmetric motor tone is noted throughout.  Gait and station: Gait is normal.    DIAGNOSTIC DATA (LABS, IMAGING, TESTING) - I reviewed patient records, labs, notes, testing and imaging myself where available.  No flowsheet data found.   No results found for: WBC, HGB, HCT, MCV, PLT No results found for: NA, K, CL, CO2, GLUCOSE, BUN, CREATININE, CALCIUM, PROT, ALBUMIN, AST, ALT, ALKPHOS, BILITOT, GFRNONAA, GFRAA No results found for: CHOL, HDL, LDLCALC, LDLDIRECT, TRIG, CHOLHDL No results found for: NTIR4E No results found for: VITAMINB12 No results found for: TSH   ASSESSMENT AND PLAN 30 y.o. year old female  has a past medical history of Anxiety and depression and Headache. here with     ICD-10-CM   1. OSA on CPAP  G47.33    Z99.89         Allison Russell is doing well  on CPAP therapy. Compliance report reveals excellent daily and optimal four hour compliance. She was encouraged to continue using CPAP nightly and for greater than 4 hours each night. Risks of untreated sleep apnea review and education materials provided. Healthy lifestyle habits encouraged. She will follow up in 1 year, sooner if needed. She verbalizes understanding and agreement with this plan.    No orders of the defined types were placed in this encounter.    No orders of the defined types were placed in this encounter.     Shawnie Dapper, FNP-C 06/27/2021, 8:55 AM [facility] [address] [place], [place] [address] [phone])  [phone]   

## 2021-06-27 ENCOUNTER — Encounter: Payer: Self-pay | Admitting: Family Medicine

## 2021-06-27 ENCOUNTER — Ambulatory Visit: Payer: Medicaid Other | Admitting: Family Medicine

## 2021-06-27 VITALS — BP 140/84 | HR 79 | Ht 66.5 in | Wt 251.5 lb

## 2021-06-27 DIAGNOSIS — G4733 Obstructive sleep apnea (adult) (pediatric): Secondary | ICD-10-CM

## 2021-06-27 DIAGNOSIS — Z9989 Dependence on other enabling machines and devices: Secondary | ICD-10-CM | POA: Diagnosis not present

## 2022-06-27 ENCOUNTER — Ambulatory Visit: Payer: Medicaid Other | Admitting: Family Medicine

## 2022-11-26 ENCOUNTER — Encounter: Payer: Self-pay | Admitting: Family Medicine

## 2022-11-26 ENCOUNTER — Ambulatory Visit: Payer: Medicaid Other | Admitting: Family Medicine

## 2022-11-26 NOTE — Progress Notes (Deleted)
PATIENT: Allison Russell DOB: Nov 24, 1990  REASON FOR VISIT: follow up HISTORY FROM: patient  No chief complaint on file.    HISTORY OF PRESENT ILLNESS:  11/26/22 ALL:  Allison Russell returns for follow up for OSA on CPAP.   06/27/2021 ALL: Allison Russell is a 31 y.o. female here today for follow up for OSA on CPAP. HST 01/16/2021 showed OSA with total AHI 10.9/hr, REM AHI 37.1 and O2 nadir of 78%. AutoPAP ordered with pressure of 6-16cmH20 and nasal mask. She reports doing failry well on therapy. She feels that she is getting more comfortable with therapy. She does occasionally pull the mask off at night while sleeping. She feels this is getting better with time. No specific concerns with machine or supplies. She is resting better. She reports headaches are less severe and less frequent.   Compliance report dated 05/28/2021-06/26/2021 shows that she used CPAP 28/30 days for compliance of 93%. She used CPAP greater than 4 hours 25/30 days for compliance of 83%. Average usage was 6.8 hours. Residual AHI 0.6/hr on 6-16cmH20. No leak noted.   HISTORY: (copied from Dr Dohmeier's previous note)  Allison Russell is a 32 year old Caucasian female patient seen here upon a referral on 11/09/2020 from PCP for a sleep evaluation.  Chief concern according to patient :  see above    I have the pleasure of seeing Allison Russell today, right -handed Caucasian female with a possible sleep disorder.  She  has a past medical history of Anxiety and depression and Headache..   Sleep relevant medical history: NON refreshing sleep, sleep paralysis, no Tonsillectomy,  Obesity.   Family medical /sleep history: father had OSA, passed at age 44 of a Myocardial infarction.she was 21 at the time .    Social history:  Patient is working as a Naval architect, shift working ,   and lives with spouse and 3 young chidren . 3  Dogs.  Tobacco use- active, 1/2 ppd.Marland Kitchen  ETOH use ; none ,  Caffeine intake in form of  Soda( 4 a day)   Regular exercise in form of n/a    Sleep habits are as follows: The patient's dinner time is between 8-11 PM.  The patient goes to bed at 11 PM and has to leave at 4.30. AM . The preferred sleep position is right side , with the support of 3 pillows. She snores, she gasps.   Dreams are reportedly frequent/vivid.  4.30  AM is the usual rise time for early shift, and late shift days start at 1 PM.  The patient wakes up with an alarm.  She reports not feeling refreshed or restored in AM, with symptoms such as dry mouth , morning and chronic headaches, and residual fatigue.  Naps are taken infrequently, lasting from 2 hours- to 6 hours.   REVIEW OF SYSTEMS: Out of a complete 14 system review of symptoms, the patient complains only of the following symptoms, headaches and all other reviewed systems are negative.  ESS: previously 6/24, now 4/24 FSS: 15/63  ALLERGIES: No Known Allergies  HOME MEDICATIONS: No outpatient medications prior to visit.   No facility-administered medications prior to visit.    PAST MEDICAL HISTORY: Past Medical History:  Diagnosis Date   Anxiety and depression    Headache     PAST SURGICAL HISTORY: Past Surgical History:  Procedure Laterality Date   CESAREAN SECTION      FAMILY HISTORY: Family History  Problem Relation Age of Onset  Hypertension Mother    Diabetes Father    Heart attack Father    Sleep apnea Father    Breast cancer Maternal Grandmother    Bone cancer Maternal Grandmother    Cancer Paternal Grandmother    Cancer Paternal Grandfather     SOCIAL HISTORY: Social History   Socioeconomic History   Marital status: Single    Spouse name: Not on file   Number of children: Not on file   Years of education: Not on file   Highest education level: Not on file  Occupational History   Not on file  Tobacco Use   Smoking status: Every Day    Packs/day: 0.50    Types: Cigarettes   Smokeless tobacco: Never  Substance and Sexual  Activity   Alcohol use: Not Currently   Drug use: Never   Sexual activity: Not on file  Other Topics Concern   Not on file  Social History Narrative   Not on file   Social Determinants of Health   Financial Resource Strain: Not on file  Food Insecurity: Not on file  Transportation Needs: Not on file  Physical Activity: Not on file  Stress: Not on file  Social Connections: Not on file  Intimate Partner Violence: Not on file     PHYSICAL EXAM  There were no vitals filed for this visit.  There is no height or weight on file to calculate BMI.  Generalized: Well developed, in no acute distress  Cardiology: normal rate and rhythm, no murmur noted Respiratory: clear to auscultation bilaterally  Neurological examination  Mentation: Alert oriented to time, place, history taking. Follows all commands speech and language fluent Cranial nerve II-XII: Pupils were equal round reactive to light. Extraocular movements were full, visual field were full  Motor: The motor testing reveals 5 over 5 strength of all 4 extremities. Good symmetric motor tone is noted throughout.  Gait and station: Gait is normal.    DIAGNOSTIC DATA (LABS, IMAGING, TESTING) - I reviewed patient records, labs, notes, testing and imaging myself where available.      No data to display           No results found for: "WBC", "HGB", "HCT", "MCV", "PLT" No results found for: "NA", "K", "CL", "CO2", "GLUCOSE", "BUN", "CREATININE", "CALCIUM", "PROT", "ALBUMIN", "AST", "ALT", "ALKPHOS", "BILITOT", "GFRNONAA", "GFRAA" No results found for: "CHOL", "HDL", "LDLCALC", "LDLDIRECT", "TRIG", "CHOLHDL" No results found for: "HGBA1C" No results found for: "VITAMINB12" No results found for: "TSH"   ASSESSMENT AND PLAN 32 y.o. year old female  has a past medical history of Anxiety and depression and Headache. here with   No diagnosis found.     Allison Russell is doing well on CPAP therapy. Compliance report reveals  excellent daily and optimal four hour compliance. She was encouraged to continue using CPAP nightly and for greater than 4 hours each night. Risks of untreated sleep apnea review and education materials provided. Healthy lifestyle habits encouraged. She will follow up in 1 year, sooner if needed. She verbalizes understanding and agreement with this plan.    No orders of the defined types were placed in this encounter.     No orders of the defined types were placed in this encounter.      Debbora Presto, FNP-C 11/26/2022, 7:27 AM [facility] [address] [place], [place] [address] [phone]
